# Patient Record
Sex: Male | Born: 1940 | Race: White | Hispanic: No | Marital: Married | Smoking: Never smoker
Health system: Southern US, Community
[De-identification: ages and names within clinical notes are randomized; demographics above are authoritative.]

## PROBLEM LIST (undated history)

## (undated) DIAGNOSIS — Z973 Presence of spectacles and contact lenses: Secondary | ICD-10-CM

## (undated) DIAGNOSIS — G5600 Carpal tunnel syndrome, unspecified upper limb: Secondary | ICD-10-CM

## (undated) DIAGNOSIS — M109 Gout, unspecified: Secondary | ICD-10-CM

## (undated) DIAGNOSIS — E119 Type 2 diabetes mellitus without complications: Secondary | ICD-10-CM

## (undated) DIAGNOSIS — E538 Deficiency of other specified B group vitamins: Secondary | ICD-10-CM

## (undated) DIAGNOSIS — R21 Rash and other nonspecific skin eruption: Secondary | ICD-10-CM

## (undated) DIAGNOSIS — N201 Calculus of ureter: Secondary | ICD-10-CM

## (undated) DIAGNOSIS — D696 Thrombocytopenia, unspecified: Secondary | ICD-10-CM

## (undated) DIAGNOSIS — Z87442 Personal history of urinary calculi: Secondary | ICD-10-CM

## (undated) DIAGNOSIS — Z8739 Personal history of other diseases of the musculoskeletal system and connective tissue: Secondary | ICD-10-CM

## (undated) DIAGNOSIS — E785 Hyperlipidemia, unspecified: Secondary | ICD-10-CM

## (undated) DIAGNOSIS — I10 Essential (primary) hypertension: Secondary | ICD-10-CM

## (undated) DIAGNOSIS — M1990 Unspecified osteoarthritis, unspecified site: Secondary | ICD-10-CM

## (undated) DIAGNOSIS — N183 Chronic kidney disease, stage 3 unspecified: Secondary | ICD-10-CM

## (undated) DIAGNOSIS — A4902 Methicillin resistant Staphylococcus aureus infection, unspecified site: Secondary | ICD-10-CM

## (undated) HISTORY — PX: TONSILLECTOMY: SUR1361

## (undated) HISTORY — DX: Methicillin resistant Staphylococcus aureus infection, unspecified site: A49.02

## (undated) HISTORY — DX: Hyperlipidemia, unspecified: E78.5

## (undated) HISTORY — PX: COLONOSCOPY: SHX174

## (undated) HISTORY — DX: Deficiency of other specified B group vitamins: E53.8

## (undated) HISTORY — PX: MOUTH SURGERY: SHX715

## (undated) HISTORY — PX: CYSTOSCOPY/URETEROSCOPY/HOLMIUM LASER/STENT PLACEMENT: SHX6546

---

## 2008-05-21 ENCOUNTER — Emergency Department (HOSPITAL_COMMUNITY): Admission: EM | Admit: 2008-05-21 | Discharge: 2008-05-21 | Payer: Self-pay | Admitting: Emergency Medicine

## 2009-08-04 IMAGING — CT CT HEAD W/O CM
1 series · 16 of 30 positions shown, 20 images · non-contrast
Comparison: None

CLINICAL DATA: Dizziness.  Syncope.  Weakness.  Diabetes.

CT HEAD WITHOUT CONTRAST
TECHNIQUE: Contiguous axial images were obtained from the base of
the skull through the vertex without contrast.

[Series 2: (id) head 4.8 h37s st · axial · 0.46mm/px · z∈[+1158,+1320]mm · 16 of 36 slices shown, 20 images]
[im 2/36  brain]
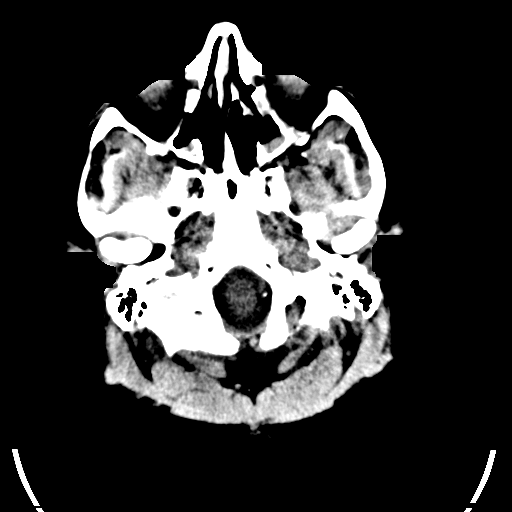
[im 2/36  bone]
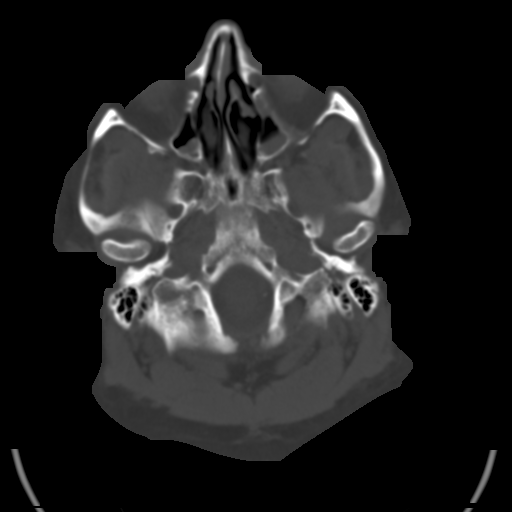
[im 4/36  brain]
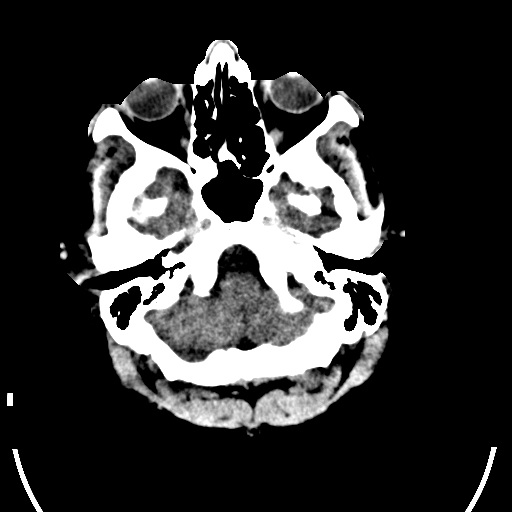
[im 7/36  brain]
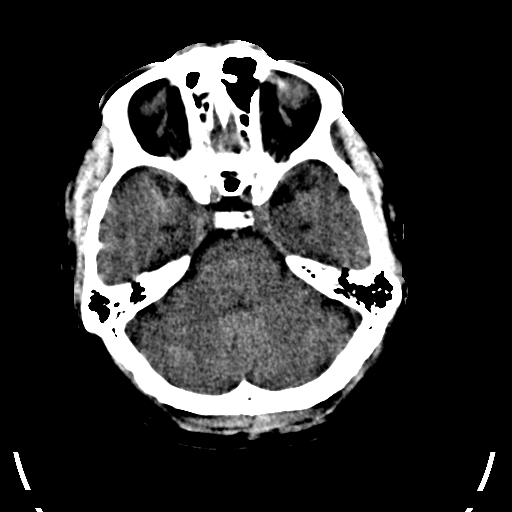
[im 9/36  brain]
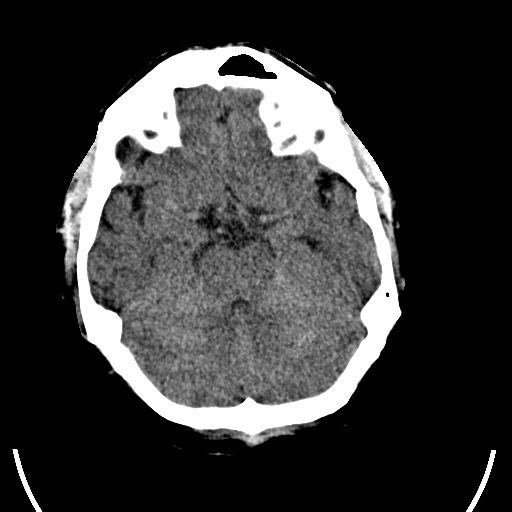
[im 10/36  brain]
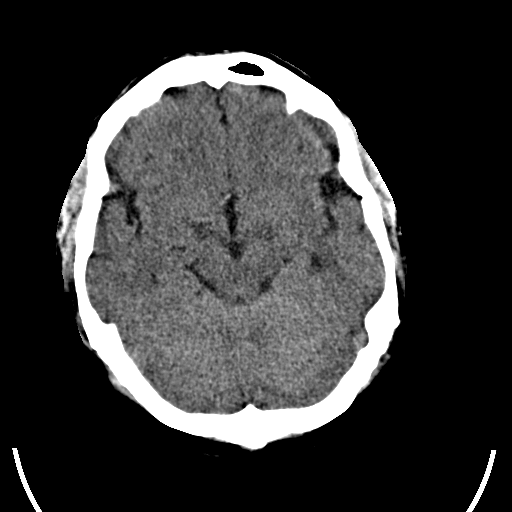
[im 10/36  bone]
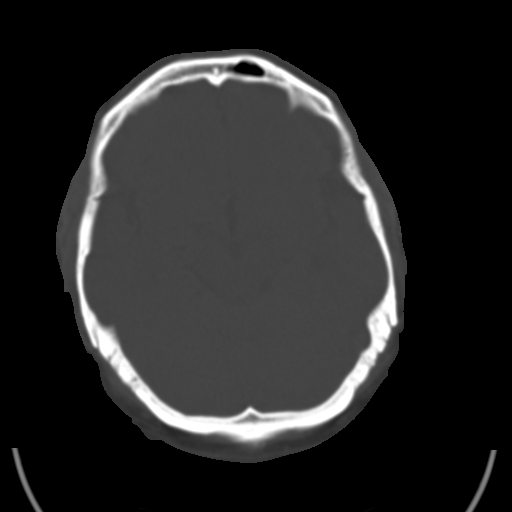
[im 13/36  brain]
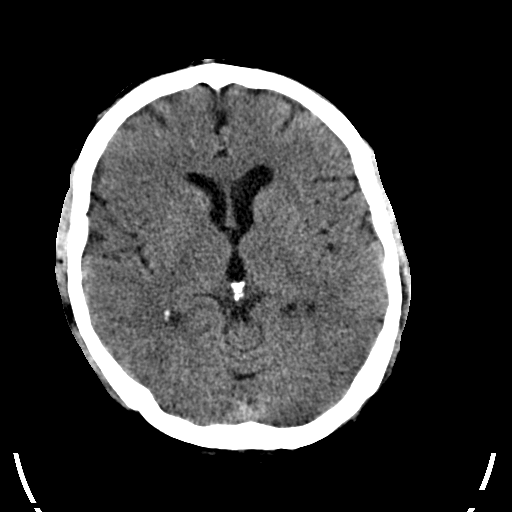
[im 15/36  brain]
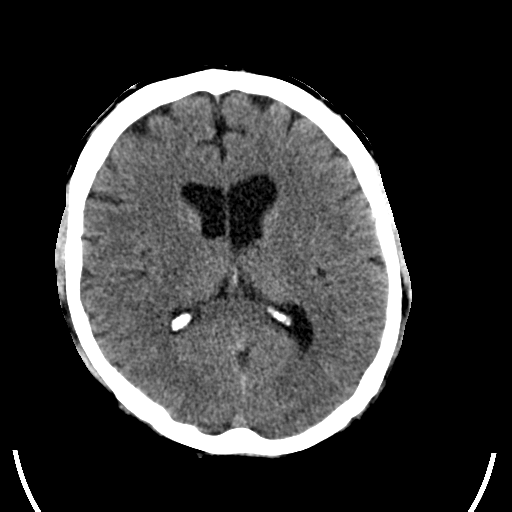
[im 17/36  brain]
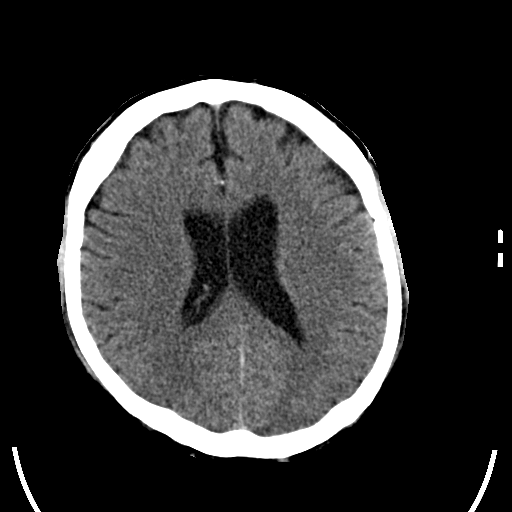
[im 19/36  brain]
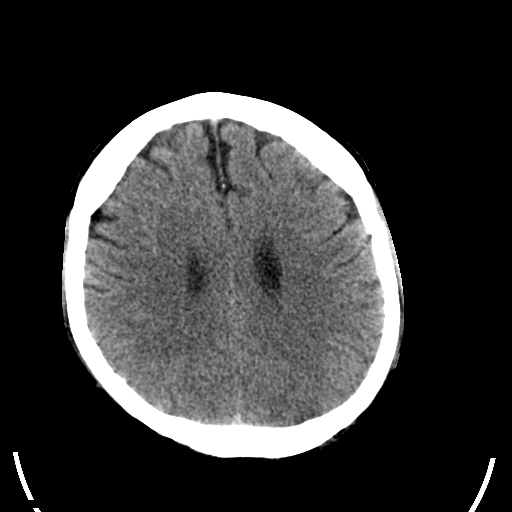
[im 19/36  bone]
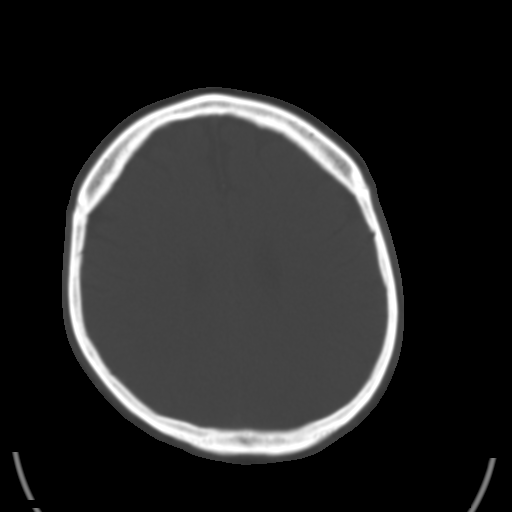
[im 21/36  brain]
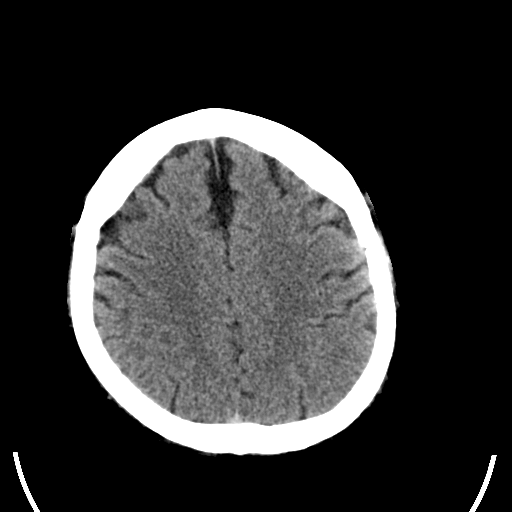
[im 23/36  brain]
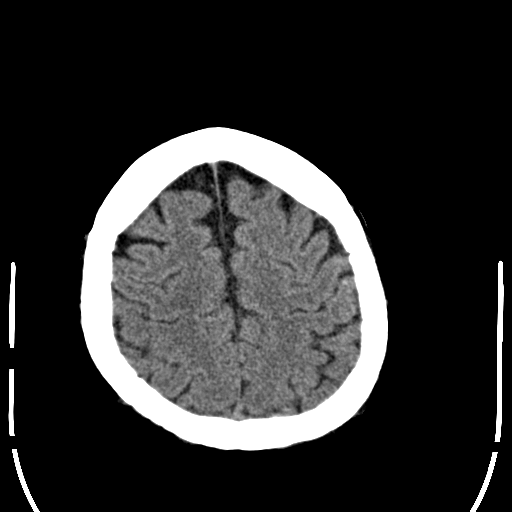
[im 26/36  brain]
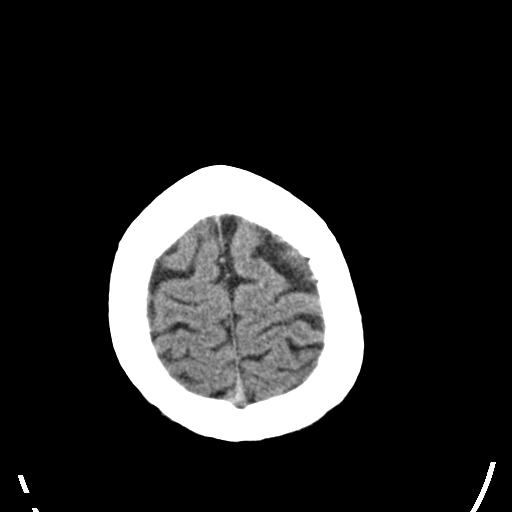
[im 27/36  brain]
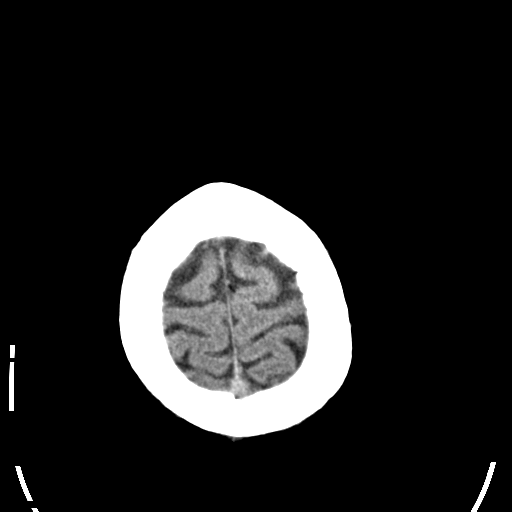
[im 27/36  bone]
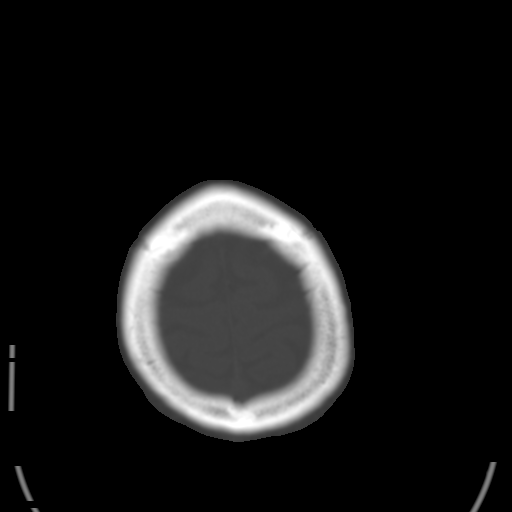
[im 29/36  brain]
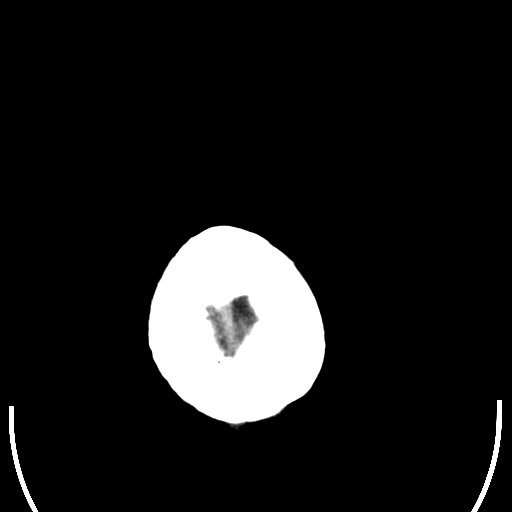
[im 32/36  brain]
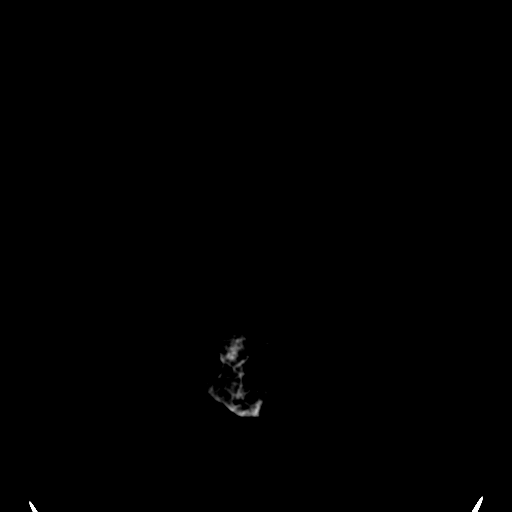
[im 34/36  brain]
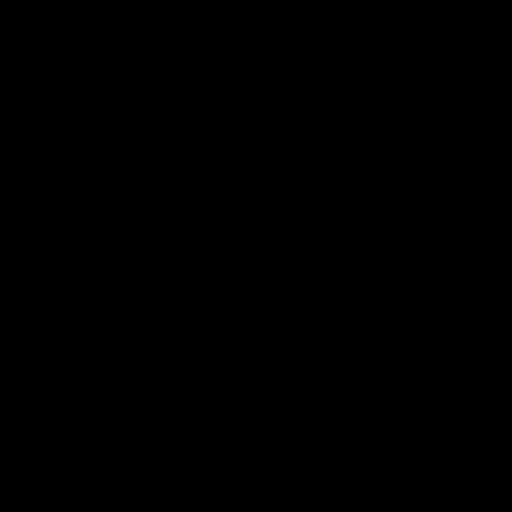

[16 of 30 positions shown; findings below may reference images not displayed]

FINDINGS: The brain does not show gross atrophy.  There is no sign
of chronic or acute infarction, mass lesion, hemorrhage,
hydrocephalus or extra-axial collection.  There is calcification
affecting the major vessels at the base of the brain.  Inflammatory
changes effect paranasal sinuses, particularly the left maxillary
sinus.  The calvarium is unremarkable.
IMPRESSION: No abnormality the brain is identified.  Calcification of the major
vessels at the base of the brain.  Probable sinusitis.

## 2010-06-11 LAB — POCT I-STAT, CHEM 8
BUN: 19 mg/dL (ref 6–23)
Calcium, Ion: 1.04 mmol/L — ABNORMAL LOW (ref 1.12–1.32)
Creatinine, Ser: 1.1 mg/dL (ref 0.4–1.5)
Glucose, Bld: 149 mg/dL — ABNORMAL HIGH (ref 70–99)
Sodium: 139 mEq/L (ref 135–145)
TCO2: 25 mmol/L (ref 0–100)

## 2010-06-11 LAB — GLUCOSE, CAPILLARY: Glucose-Capillary: 181 mg/dL — ABNORMAL HIGH (ref 70–99)

## 2010-06-11 LAB — POCT CARDIAC MARKERS: Myoglobin, poc: 105 ng/mL (ref 12–200)

## 2012-07-11 ENCOUNTER — Other Ambulatory Visit: Payer: Self-pay | Admitting: Orthopedic Surgery

## 2012-07-17 ENCOUNTER — Encounter (HOSPITAL_BASED_OUTPATIENT_CLINIC_OR_DEPARTMENT_OTHER): Payer: Self-pay | Admitting: *Deleted

## 2012-07-17 NOTE — Progress Notes (Signed)
To come in for bmet-ekg-diabetic-lisinopril for renal support-had [facility] study [facility]-did not need cpap

## 2012-07-19 ENCOUNTER — Other Ambulatory Visit: Payer: Self-pay

## 2012-07-19 ENCOUNTER — Encounter (HOSPITAL_BASED_OUTPATIENT_CLINIC_OR_DEPARTMENT_OTHER)
Admission: RE | Admit: 2012-07-19 | Discharge: 2012-07-19 | Disposition: A | Discharge status: Home / Self Care | Payer: Medicare Other | Source: Ambulatory Visit | Attending: Orthopedic Surgery | Admitting: Orthopedic Surgery

## 2012-07-19 LAB — BASIC METABOLIC PANEL
CO2: 24 mEq/L (ref 19–32)
Calcium: 9.6 mg/dL (ref 8.4–10.5)
Potassium: 4.7 mEq/L (ref 3.5–5.1)
Sodium: 139 mEq/L (ref 135–145)

## 2012-07-19 NOTE — Progress Notes (Signed)
Pt here for bloodwork and EKG. Reviewed abnormal EKG with Dr. Jacklynn Bue. No orders taken.

## 2012-07-21 NOTE — H&P (Signed)
Adam Mata is an 72 y.o. male.   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 HPI: Electrodiagnostic studies performed in [facility] revealed severe bilateral carpal tunnel syndrome left worse than right. EMG revealed abnormalities in the abductor pollicis brevis muscles bilaterally.  Past Medical History  Diagnosis Date  . Diabetes mellitus without complication   . Arthritis   . Carpal tunnel syndrome   . Gout     Past Surgical History  Procedure Laterality Date  . Tonsillectomy    . Colonoscopy    . Mouth surgery      History reviewed. No pertinent family history. Social History:  reports that he has never smoked. He does not have any smokeless tobacco history on file. He reports that he does not drink alcohol or use illicit drugs.  Allergies: No Known Allergies  No prescriptions prior to admission    Results for orders placed during the hospital encounter of 07/25/12 (from the past 48 hour(s))  BASIC METABOLIC PANEL     Status: Abnormal   Collection Time    07/19/12  3:00 PM      Result Value Range   Sodium 139  135 - 145 mEq/L   Potassium 4.7  3.5 - 5.1 mEq/L   Chloride 101  96 - 112 mEq/L   CO2 24  19 - 32 mEq/L   Glucose, Bld 123 (*) 70 - 99 mg/dL   BUN 22  6 - 23 mg/dL   Creatinine, Ser 1.61 (*) 0.50 - 1.35 mg/dL   Calcium 9.6  8.4 - 09.6 mg/dL   GFR calc non Af Amer 51 (*) >90 mL/min   GFR calc Af Amer 59 (*) >90 mL/min   Comment:            The eGFR has been calculated     using the CKD EPI equation.     This calculation has not been     validated in all clinical     situations.     eGFR's persistently     <90 mL/min signify     possible Chronic Kidney Disease.   No results found.  Review of Systems  Constitutional: Negative.   HENT: Positive for tinnitus.   Eyes:       History of cataracts. Corrective lenses.  Respiratory: Negative.   Cardiovascular:  Negative.   Gastrointestinal: Negative.   Genitourinary: Negative.   Musculoskeletal: Negative.   Skin: Negative.   Neurological:       Bilateral hand numbness consistent with carpal tunnel syndrome.  Endo/Heme/Allergies: Negative.   Psychiatric/Behavioral: Negative.     Height 5' 8.5" (1.74 m), weight 112.946 kg (249 lb). Physical Exam  Constitutional: He is oriented to person, place, and time. He appears well-developed and well-nourished.  HENT:  Head: Normocephalic and atraumatic.  Eyes: Conjunctivae and EOM are normal. Pupils are equal, round, and reactive to light.  Neck: Normal range of motion. Neck supple.  Cardiovascular: Normal rate, regular rhythm and normal heart sounds.   Respiratory: Effort normal and breath sounds normal.  GI: Soft.  Musculoskeletal:  Background osteoarthritis. Type 2 diabetes and gout. No thenar atrophy noted.  Neurological: He is alert and oriented to person, place, and time. He has normal reflexes.  Psychiatric: He has a normal mood and affect. His behavior is normal. Judgment and thought content normal.     Assessment/Plan Bilateral carpal tunnel syndrome with positive nerve conduction studies and EMG.  Plan right carpal tunnel release.  The surgery aftercare potential risks and benefits were explained in detail. Questions were invited and answered.    Adam Mata JR,Adam Mata V 07/21/2012, 2:37 PM

## 2012-07-25 ENCOUNTER — Encounter (HOSPITAL_BASED_OUTPATIENT_CLINIC_OR_DEPARTMENT_OTHER): Payer: Self-pay | Admitting: Certified Registered Nurse Anesthetist

## 2012-07-25 ENCOUNTER — Ambulatory Visit (HOSPITAL_BASED_OUTPATIENT_CLINIC_OR_DEPARTMENT_OTHER): Payer: Medicare Other | Admitting: Certified Registered Nurse Anesthetist

## 2012-07-25 ENCOUNTER — Ambulatory Visit (HOSPITAL_BASED_OUTPATIENT_CLINIC_OR_DEPARTMENT_OTHER)
Admission: RE | Admit: 2012-07-25 | Discharge: 2012-07-25 | Disposition: A | Discharge status: Home / Self Care | Payer: Medicare Other | Source: Ambulatory Visit | Attending: Orthopedic Surgery | Admitting: Orthopedic Surgery

## 2012-07-25 ENCOUNTER — Encounter (HOSPITAL_BASED_OUTPATIENT_CLINIC_OR_DEPARTMENT_OTHER)
Admission: RE | Disposition: A | Discharge status: Home / Self Care | Payer: Self-pay | Source: Ambulatory Visit | Attending: Orthopedic Surgery

## 2012-07-25 DIAGNOSIS — G5600 Carpal tunnel syndrome, unspecified upper limb: Secondary | ICD-10-CM | POA: Insufficient documentation

## 2012-07-25 DIAGNOSIS — Z01810 Encounter for preprocedural cardiovascular examination: Secondary | ICD-10-CM | POA: Insufficient documentation

## 2012-07-25 DIAGNOSIS — Z01812 Encounter for preprocedural laboratory examination: Secondary | ICD-10-CM | POA: Insufficient documentation

## 2012-07-25 DIAGNOSIS — M129 Arthropathy, unspecified: Secondary | ICD-10-CM | POA: Insufficient documentation

## 2012-07-25 DIAGNOSIS — M109 Gout, unspecified: Secondary | ICD-10-CM | POA: Insufficient documentation

## 2012-07-25 DIAGNOSIS — E119 Type 2 diabetes mellitus without complications: Secondary | ICD-10-CM | POA: Insufficient documentation

## 2012-07-25 DIAGNOSIS — Z6838 Body mass index (BMI) 38.0-38.9, adult: Secondary | ICD-10-CM | POA: Insufficient documentation

## 2012-07-25 DIAGNOSIS — H269 Unspecified cataract: Secondary | ICD-10-CM | POA: Insufficient documentation

## 2012-07-25 DIAGNOSIS — H9319 Tinnitus, unspecified ear: Secondary | ICD-10-CM | POA: Insufficient documentation

## 2012-07-25 HISTORY — DX: Unspecified osteoarthritis, unspecified site: M19.90

## 2012-07-25 HISTORY — DX: Type 2 diabetes mellitus without complications: E11.9

## 2012-07-25 HISTORY — DX: Carpal tunnel syndrome, unspecified upper limb: G56.00

## 2012-07-25 HISTORY — DX: Gout, unspecified: M10.9

## 2012-07-25 HISTORY — PX: CARPAL TUNNEL RELEASE: SHX101

## 2012-07-25 LAB — GLUCOSE, CAPILLARY
Glucose-Capillary: 143 mg/dL — ABNORMAL HIGH (ref 70–99)
Glucose-Capillary: 117 mg/dL — ABNORMAL HIGH (ref 70–99)

## 2012-07-25 SURGERY — CARPAL TUNNEL RELEASE
Anesthesia: General | Site: Wrist | Laterality: Right | Wound class: Clean

## 2012-07-25 MED ORDER — FENTANYL CITRATE 0.05 MG/ML IJ SOLN
INTRAMUSCULAR | Status: DC | PRN
  Administered 2012-07-25: 50 ug via INTRAVENOUS

## 2012-07-25 MED ORDER — OXYCODONE-ACETAMINOPHEN 5-325 MG PO TABS: ORAL_TABLET | ORAL | Status: DC

## 2012-07-25 MED ORDER — LIDOCAINE HCL 2 % IJ SOLN
INTRAMUSCULAR | Status: DC | PRN
  Administered 2012-07-25: 2 mL

## 2012-07-25 MED ORDER — ONDANSETRON HCL 4 MG/2ML IJ SOLN
INTRAMUSCULAR | Status: DC | PRN
  Administered 2012-07-25: 4 mg via INTRAVENOUS

## 2012-07-25 MED ORDER — FENTANYL CITRATE 0.05 MG/ML IJ SOLN: 50.0000 ug | INTRAMUSCULAR | Status: DC | PRN

## 2012-07-25 MED ORDER — PROPOFOL 10 MG/ML IV BOLUS
INTRAVENOUS | Status: DC | PRN
  Administered 2012-07-25: 200 mg via INTRAVENOUS

## 2012-07-25 MED ORDER — CHLORHEXIDINE GLUCONATE 4 % EX LIQD: 60.0000 mL | Freq: Once | CUTANEOUS | Status: DC

## 2012-07-25 MED ORDER — LACTATED RINGERS IV SOLN
INTRAVENOUS | Status: DC
  Administered 2012-07-25: 10:00:00 via INTRAVENOUS

## 2012-07-25 MED ORDER — MIDAZOLAM HCL 2 MG/2ML IJ SOLN: 1.0000 mg | INTRAMUSCULAR | Status: DC | PRN

## 2012-07-25 MED ORDER — DEXAMETHASONE SODIUM PHOSPHATE 10 MG/ML IJ SOLN
INTRAMUSCULAR | Status: DC | PRN
  Administered 2012-07-25: 4 mg via INTRAVENOUS

## 2012-07-25 MED ORDER — LIDOCAINE HCL (CARDIAC) 20 MG/ML IV SOLN
INTRAVENOUS | Status: DC | PRN
  Administered 2012-07-25: 60 mg via INTRAVENOUS

## 2012-07-25 SURGICAL SUPPLY — 35 items
BANDAGE ADHESIVE 1X3 (GAUZE/BANDAGES/DRESSINGS) IMPLANT
BANDAGE ELASTIC 3 VELCRO ST LF (GAUZE/BANDAGES/DRESSINGS) ×2 IMPLANT
BLADE SURG 15 STRL LF DISP TIS (BLADE) ×1 IMPLANT
BLADE SURG 15 STRL SS (BLADE) ×1
BNDG ESMARK 4X9 LF (GAUZE/BANDAGES/DRESSINGS) ×2 IMPLANT
BRUSH SCRUB EZ PLAIN DRY (MISCELLANEOUS) ×2 IMPLANT
CLOTH BEACON ORANGE TIMEOUT ST (SAFETY) ×2 IMPLANT
CORDS BIPOLAR (ELECTRODE) ×2 IMPLANT
COVER MAYO STAND STRL (DRAPES) ×2 IMPLANT
COVER TABLE BACK 60X90 (DRAPES) ×2 IMPLANT
CUFF TOURNIQUET SINGLE 18IN (TOURNIQUET CUFF) ×2 IMPLANT
DECANTER SPIKE VIAL GLASS SM (MISCELLANEOUS) IMPLANT
DRAPE EXTREMITY T 121X128X90 (DRAPE) ×2 IMPLANT
DRAPE SURG 17X23 STRL (DRAPES) ×2 IMPLANT
GLOVE BIO SURGEON STRL SZ 6.5 (GLOVE) ×2 IMPLANT
GLOVE BIOGEL M STRL SZ7.5 (GLOVE) IMPLANT
GLOVE ORTHO TXT STRL SZ7.5 (GLOVE) ×2 IMPLANT
GOWN BRE IMP PREV XXLGXLNG (GOWN DISPOSABLE) ×2 IMPLANT
GOWN PREVENTION PLUS XLARGE (GOWN DISPOSABLE) ×2 IMPLANT
NEEDLE 27GAX1X1/2 (NEEDLE) ×2 IMPLANT
PACK BASIN DAY SURGERY FS (CUSTOM PROCEDURE TRAY) ×2 IMPLANT
PAD CAST 3X4 CTTN HI CHSV (CAST SUPPLIES) ×1 IMPLANT
PADDING CAST ABS 4INX4YD NS (CAST SUPPLIES) ×1
PADDING CAST ABS COTTON 4X4 ST (CAST SUPPLIES) ×1 IMPLANT
PADDING CAST COTTON 3X4 STRL (CAST SUPPLIES) ×1
SPLINT PLASTER CAST XFAST 3X15 (CAST SUPPLIES) ×5 IMPLANT
SPLINT PLASTER XTRA FASTSET 3X (CAST SUPPLIES) ×5
SPONGE GAUZE 4X4 12PLY (GAUZE/BANDAGES/DRESSINGS) ×2 IMPLANT
STOCKINETTE 4X48 STRL (DRAPES) ×2 IMPLANT
STRIP CLOSURE SKIN 1/2X4 (GAUZE/BANDAGES/DRESSINGS) ×2 IMPLANT
SUT PROLENE 3 0 PS 2 (SUTURE) ×2 IMPLANT
SYR 3ML 23GX1 SAFETY (SYRINGE) IMPLANT
SYR CONTROL 10ML LL (SYRINGE) ×2 IMPLANT
TRAY DSU PREP LF (CUSTOM PROCEDURE TRAY) ×2 IMPLANT
UNDERPAD 30X30 INCONTINENT (UNDERPADS AND DIAPERS) ×2 IMPLANT

## 2012-07-25 NOTE — Anesthesia Preprocedure Evaluation (Signed)
Anesthesia Evaluation  Patient identified by MRN, date of birth, ID band Patient awake    Reviewed: Allergy & Precautions, H&P , NPO status , Patient's Chart, lab work & pertinent test results  Airway Mallampati: I TM Distance: >3 FB Neck ROM: Full    Dental  (+) Teeth Intact and Dental Advisory Given   Pulmonary  breath sounds clear to auscultation        Cardiovascular Rhythm:Regular Rate:Normal     Neuro/Psych    GI/Hepatic   Endo/Other  diabetes, Well Controlled, Type 2, Oral Hypoglycemic AgentsMorbid obesity  Renal/GU      Musculoskeletal   Abdominal   Peds  Hematology   Anesthesia Other Findings   Reproductive/Obstetrics                           Anesthesia Physical Anesthesia Plan  ASA: II  Anesthesia Plan: General   Post-op Pain Management:    Induction: Intravenous  Airway Management Planned: LMA  Additional Equipment:   Intra-op Plan:   Post-operative Plan: Extubation in OR  Informed Consent: I have reviewed the patients History and Physical, chart, labs and discussed the procedure including the risks, benefits and alternatives for the proposed anesthesia with the patient or authorized representative who has indicated his/her understanding and acceptance.   Dental advisory given  Plan Discussed with: CRNA, Anesthesiologist and Surgeon  Anesthesia Plan Comments:         Anesthesia Quick Evaluation

## 2012-07-25 NOTE — Anesthesia Postprocedure Evaluation (Signed)
  Anesthesia Post-op Note  Patient: Adam Mata  Procedure(s) Performed: Procedure(s): CARPAL TUNNEL RELEASE (Right)  Patient Location: PACU  Anesthesia Type:General  Level of Consciousness: awake, alert  and oriented  Airway and Oxygen Therapy: Patient Spontanous Breathing  Post-op Pain: mild  Post-op Assessment: Post-op Vital signs reviewed  Post-op Vital Signs: Reviewed  Complications: No apparent anesthesia complications

## 2012-07-25 NOTE — Interval H&P Note (Signed)
History and Physical Interval Note:  07/25/2012 10:44 AM  Adam Mata  has presented today for surgery, with the diagnosis of RIGHT AND LEFT CARPAL TUNNEL SYNDROME  The various methods of treatment have been discussed with the patient and family. After consideration of risks, benefits and other options for treatment, the patient has consented to  Procedure(s): CARPAL TUNNEL RELEASE (Right) as a surgical intervention .  The patient's history has been reviewed, patient examined, no change in status, stable for surgery.  I have reviewed the patient's chart and labs.  Questions were answered to the patient's satisfaction.     Shanita Kanan JR,Yomaira Solar V

## 2012-07-25 NOTE — Transfer of Care (Signed)
Immediate Anesthesia Transfer of Care Note  Patient: Adam Mata  Procedure(s) Performed: Procedure(s): CARPAL TUNNEL RELEASE (Right)  Patient Location: PACU  Anesthesia Type:General  Level of Consciousness: awake, alert , oriented and patient cooperative  Airway & Oxygen Therapy: Patient Spontanous Breathing and Patient connected to face mask oxygen  Post-op Assessment: Report given to PACU RN and Post -op Vital signs reviewed and stable  Post vital signs: Reviewed and stable  Complications: No apparent anesthesia complications

## 2012-07-25 NOTE — Brief Op Note (Signed)
07/25/2012  11:22 AM  PATIENT:  Adam Mata  72 y.o. male  PRE-OPERATIVE DIAGNOSIS:  RIGHT AND LEFT CARPAL TUNNEL SYNDROME  POST-OPERATIVE DIAGNOSIS:  RIGHT CARPAL TUNNEL SYNDROME  PROCEDURE:  Procedure(s): CARPAL TUNNEL RELEASE (Right)  SURGEON:  Surgeon(s) and Role:    * Wyn Forster., MD - Primary  PHYSICIAN ASSISTANT:   ASSISTANTS: surgical technician   ANESTHESIA:   general  EBL:     BLOOD ADMINISTERED:none  DRAINS: none   LOCAL MEDICATIONS USED:  XYLOCAINE   SPECIMEN:  No Specimen  DISPOSITION OF SPECIMEN:  N/A  COUNTS:  YES  TOURNIQUET:   Total Tourniquet Time Documented: Upper Arm (Right) - 11 minutes Total: Upper Arm (Right) - 11 minutes   DICTATION: .Other Dictation: Dictation Number [id number]  PLAN OF CARE: Discharge to home after PACU  PATIENT DISPOSITION:  PACU - hemodynamically stable.   Delay start of Pharmacological VTE agent (>24hrs) due to surgical blood loss or risk of bleeding: not applicable

## 2012-07-25 NOTE — Op Note (Signed)
[id number] 

## 2012-07-26 NOTE — Op Note (Signed)
Adam Mata, Adam Mata              ACCOUNT NO.:  [id number]  MEDICAL RECORD NO.:  [id number]  LOCATION:                                 FACILITY:  PHYSICIAN:  Katy Fitch. Damoni Erker, M.D.      DATE OF BIRTH:  DATE OF PROCEDURE:  07/25/2012 DATE OF DISCHARGE:                              OPERATIVE REPORT   PREOPERATIVE DIAGNOSES:  Chronic carpal tunnel syndrome, right wrist with background gout and diabetes.  POSTOPERATIVE DIAGNOSES:  Chronic carpal tunnel syndrome, right wrist with background gout and diabetes.  OPERATION:  Release of right transverse carpal ligament.  OPERATING SURGEON:  Katy Fitch. Armand Preast, MD  ASSISTANT:  Surgical technician.  ANESTHESIA:  General by LMA.  SUPERVISING ANESTHESIOLOGIST:  Sheldon Silvan, MD  INDICATIONS:  Adam Mata is a 72 year old gentleman, referred through the courtesy of Dr. Renae Fickle of [place], [facility] for evaluation and management of hand numbness.  Adam Mata, has a complex past medical history of chronic gout, hypertension, and diabetes.  He is under the able care of Dr. Samuel Mata, who is managing disease predicament.  During his recent medical evaluation, he was noted have bilateral hand numbness.  He is referred to see Dr. Cathrine Muster at [facility] and had a length of diagnostic studies which confirmed severe bilateral carpal tunnel syndrome.  He presented for Hand Surgery consult.  We advised him to consider staged bilateral carpal tunnel release beginning on the right side.  Preoperatively, he was reminded the potential that risks and benefits of surgery.  He understands with his severe carpal tunnel syndrome, he will need to wait 4-5 months to see the full benefit of surgery, but should have relief of his night pain, relatively quickly and will gradually have improve sensibility over the next 5-6 months.  Questions regarding the anticipated procedure invited and answered in detail.  He was interviewed in the holding area by  Dr. Ivin Booty, who provided detailed anesthesia informed consent.  He recommended general anesthesia by LMA technique which was accepted by Adam Mata.  In the holding area, Adam Mata right hand was marked as the proper surgical site per protocol.  PROCEDURE IN DETAIL:  Adam Mata was brought to room 2 of the [facility] and placed in supine position on the operating table.  Following the induction of general anesthesia by LMA technique under Dr. Ivin Booty, directed supervision, the right hand and arm were prepped with Betadine soap and solution, sterilely draped.  A pneumatic tourniquet was applied to the proximal right brachium and set at 220 mmHg.  Following exsanguination of right arm with Esmarch bandage, arterial tourniquet was inflated.  A routine surgical time-out was accomplished.  A short 2-cm incision was fashioned in line of the ring finger of the palm.  Subcutaneous tissues were carefully divided in the palmar fascia. This split longitudinally through the common sensory branch of median nerve.  These were followed back to the median nerve proper which was gently isolated from the transverse carpal ligament, Penfield 4 elevator.  The ulnar bursa was noted to be quite fibrotic with adhesions between the median nerve and the portion of the transverse carpal ligament.  With great care, the transcarpal  ligament was released along its ulnar border extending into the distal forearm.  The volar forearm fascia was likewise released with scissors.  This widely opened carpal canal.  No mass or other predicaments were noted.  Bleeding points along the margin of the released ligament were electrocauterized with bipolar current followed by repair of the skin with intradermal 3-0 Prolene suture.  A compressive dressing was applied with volar plaster splint maintaining the wrist in 10 degrees of dorsiflexion.  For aftercare, Mr. Krantz is provided a prescription for  Percocet 5 mg 1 or 2 tablets p.o. q.4-6 h. p.r.n. pain, 20 tablets without refill.     Katy Fitch Valgene Deloatch, M.D.     RVS/MEDQ  D:  07/25/2012  T:  07/26/2012  Job:  [id number]  cc:   Renae Fickle Fax: [phone]

## 2012-08-25 ENCOUNTER — Other Ambulatory Visit: Payer: Self-pay | Admitting: Orthopedic Surgery

## 2012-08-30 ENCOUNTER — Encounter (HOSPITAL_BASED_OUTPATIENT_CLINIC_OR_DEPARTMENT_OTHER): Payer: Self-pay | Admitting: *Deleted

## 2012-08-30 NOTE — Progress Notes (Signed)
Pt was here 5/14 for rt ctr-did well-no pain-ready to get lt done-will need istat-had ekg 5/14

## 2012-09-04 NOTE — H&P (Signed)
  Adam Mata is an 72 y.o. male.   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 He has had a history of bilateral hand numbness and discomfort. [facility] He was partially worked up for carpal tunnel syndrome and had electrodiagnostic studies ultimately with Su Walha in [place] demonstrating severe bilateral carpal tunnel syndrome. His conduction velocity across the left elbow of the ulnar nerve was slow but not frankly pathologic. He does not note numbness in the small fingers bilaterally.    Past Medical History  Diagnosis Date  . Diabetes mellitus without complication   . Arthritis   . Carpal tunnel syndrome   . Gout     Past Surgical History  Procedure Laterality Date  . Tonsillectomy    . Colonoscopy    . Mouth surgery    . Carpal tunnel release Right 07/25/2012    Procedure: CARPAL TUNNEL RELEASE;  Surgeon: Tranquilino Fischler V Sylvan Lahm Jr., MD;  Location: [facility];  Service: Orthopedics;  Laterality: Right;    History reviewed. No pertinent family history. Social History:  reports that he has never smoked. He does not have any smokeless tobacco history on file. He reports that he does not drink alcohol or use illicit drugs.  Allergies: No Known Allergies  No prescriptions prior to admission    No results found for this or any previous visit (from the past 48 hour(s)).  No results found.   Pertinent items are noted in HPI.  Height 5' 8" (1.727 m), weight 113.399 kg (250 lb).  General appearance: alert Head: Normocephalic, without obvious abnormality Neck: supple, symmetrical, trachea midline Resp: clear to auscultation bilaterally Cardio: regular rate and rhythm GI: normal findings: bowel sounds normal Extremities:  Inspection of his hands reveals no thenar atrophy. His sweat patterns are  diminished in the median distribution bilaterally. His dermatoglyphics are preserved in median and ulnar. He has a negative elbow hyperflexion test. He has positive provocative signs of carpal tunnel syndrome. He has no sign of STS at this time but reports he has had some occasional triggering of his fingers in the past.   His electrodiagnostic studies performed by Dr. Walha are reviewed in detail. He has severe bilateral carpal tunnel syndrome left worse than right. He has positive electrodiagnostic studies with polyphasics in the abductor pollicis brevis bilaterally.  Pulses: 2+ and symmetric Skin: normal Neurologic: Grossly normal    Assessment/Plan Impression: Left CTS  Plan: TO the OR for left CTR.The procedure, risks,benefits and post-op course were discussed with the patient at length and they were in agreement with the plan.  DASNOIT,Shronda Boeh J 09/04/2012, 3:56 PM   H&P documentation: 09/05/2012  -History and Physical Reviewed  -Patient has been re-examined  -No change in the plan of care  Nicki Gracy V Vickey Ewbank Jr, MD     

## 2012-09-05 ENCOUNTER — Ambulatory Visit (HOSPITAL_BASED_OUTPATIENT_CLINIC_OR_DEPARTMENT_OTHER): Admission: RE | Admit: 2012-09-05 | Payer: Medicare Other | Source: Ambulatory Visit | Admitting: Orthopedic Surgery

## 2012-09-05 ENCOUNTER — Other Ambulatory Visit: Payer: Self-pay | Admitting: Orthopedic Surgery

## 2012-09-05 SURGERY — CARPAL TUNNEL RELEASE
Anesthesia: General | Site: Wrist | Laterality: Left

## 2012-09-12 ENCOUNTER — Encounter (HOSPITAL_BASED_OUTPATIENT_CLINIC_OR_DEPARTMENT_OTHER): Payer: Self-pay | Admitting: *Deleted

## 2012-09-19 ENCOUNTER — Ambulatory Visit (HOSPITAL_BASED_OUTPATIENT_CLINIC_OR_DEPARTMENT_OTHER)
Admission: RE | Admit: 2012-09-19 | Discharge: 2012-09-19 | Disposition: A | Discharge status: Home / Self Care | Payer: Medicare Other | Source: Ambulatory Visit | Attending: Orthopedic Surgery | Admitting: Orthopedic Surgery

## 2012-09-19 ENCOUNTER — Encounter (HOSPITAL_BASED_OUTPATIENT_CLINIC_OR_DEPARTMENT_OTHER)
Admission: RE | Disposition: A | Discharge status: Home / Self Care | Payer: Self-pay | Source: Ambulatory Visit | Attending: Orthopedic Surgery

## 2012-09-19 ENCOUNTER — Ambulatory Visit (HOSPITAL_BASED_OUTPATIENT_CLINIC_OR_DEPARTMENT_OTHER): Payer: Medicare Other | Admitting: Certified Registered"

## 2012-09-19 ENCOUNTER — Encounter (HOSPITAL_BASED_OUTPATIENT_CLINIC_OR_DEPARTMENT_OTHER): Payer: Self-pay | Admitting: Certified Registered"

## 2012-09-19 ENCOUNTER — Encounter (HOSPITAL_BASED_OUTPATIENT_CLINIC_OR_DEPARTMENT_OTHER): Payer: Self-pay | Admitting: *Deleted

## 2012-09-19 DIAGNOSIS — M6530 Trigger finger, unspecified finger: Secondary | ICD-10-CM | POA: Insufficient documentation

## 2012-09-19 DIAGNOSIS — M129 Arthropathy, unspecified: Secondary | ICD-10-CM | POA: Insufficient documentation

## 2012-09-19 DIAGNOSIS — M109 Gout, unspecified: Secondary | ICD-10-CM | POA: Insufficient documentation

## 2012-09-19 DIAGNOSIS — G5600 Carpal tunnel syndrome, unspecified upper limb: Secondary | ICD-10-CM | POA: Insufficient documentation

## 2012-09-19 DIAGNOSIS — E119 Type 2 diabetes mellitus without complications: Secondary | ICD-10-CM | POA: Insufficient documentation

## 2012-09-19 DIAGNOSIS — Z6838 Body mass index (BMI) 38.0-38.9, adult: Secondary | ICD-10-CM | POA: Insufficient documentation

## 2012-09-19 HISTORY — PX: CARPAL TUNNEL RELEASE: SHX101

## 2012-09-19 HISTORY — PX: TRIGGER FINGER RELEASE: SHX641

## 2012-09-19 LAB — POCT I-STAT, CHEM 8
Creatinine, Ser: 1 mg/dL (ref 0.50–1.35)
HCT: 45 % (ref 39.0–52.0)
Hemoglobin: 15.3 g/dL (ref 13.0–17.0)
Sodium: 140 mEq/L (ref 135–145)
TCO2: 26 mmol/L (ref 0–100)

## 2012-09-19 LAB — GLUCOSE, CAPILLARY

## 2012-09-19 SURGERY — CARPAL TUNNEL RELEASE
Anesthesia: General | Site: Wrist | Laterality: Left | Wound class: Clean

## 2012-09-19 MED ORDER — CHLORHEXIDINE GLUCONATE 4 % EX LIQD: 60.0000 mL | Freq: Once | CUTANEOUS | Status: DC

## 2012-09-19 MED ORDER — PHENYLEPHRINE HCL 10 MG/ML IJ SOLN
10.0000 mg | INTRAVENOUS | Status: DC | PRN
  Administered 2012-09-19: 50 ug/min via INTRAVENOUS

## 2012-09-19 MED ORDER — FENTANYL CITRATE 0.05 MG/ML IJ SOLN
INTRAMUSCULAR | Status: DC | PRN
  Administered 2012-09-19: 75 ug via INTRAVENOUS

## 2012-09-19 MED ORDER — OXYCODONE HCL 5 MG/5ML PO SOLN: 5.0000 mg | Freq: Once | ORAL | Status: DC | PRN

## 2012-09-19 MED ORDER — MEPERIDINE HCL 25 MG/ML IJ SOLN: 6.2500 mg | INTRAMUSCULAR | Status: DC | PRN

## 2012-09-19 MED ORDER — EPHEDRINE SULFATE 50 MG/ML IJ SOLN
INTRAMUSCULAR | Status: DC | PRN
  Administered 2012-09-19: 5 mg via INTRAVENOUS
  Administered 2012-09-19: 10 mg via INTRAVENOUS
  Administered 2012-09-19: 5 mg via INTRAVENOUS

## 2012-09-19 MED ORDER — MIDAZOLAM HCL 2 MG/2ML IJ SOLN: 1.0000 mg | INTRAMUSCULAR | Status: DC | PRN

## 2012-09-19 MED ORDER — FENTANYL CITRATE 0.05 MG/ML IJ SOLN: 25.0000 ug | INTRAMUSCULAR | Status: DC | PRN

## 2012-09-19 MED ORDER — LIDOCAINE HCL 2 % IJ SOLN
INTRAMUSCULAR | Status: DC | PRN
  Administered 2012-09-19: 7 mL

## 2012-09-19 MED ORDER — OXYCODONE HCL 5 MG PO TABS: 5.0000 mg | ORAL_TABLET | Freq: Once | ORAL | Status: DC | PRN

## 2012-09-19 MED ORDER — MIDAZOLAM HCL 2 MG/2ML IJ SOLN: 0.5000 mg | Freq: Once | INTRAMUSCULAR | Status: DC | PRN

## 2012-09-19 MED ORDER — PROMETHAZINE HCL 25 MG/ML IJ SOLN: 6.2500 mg | INTRAMUSCULAR | Status: DC | PRN

## 2012-09-19 MED ORDER — PROPOFOL 10 MG/ML IV BOLUS
INTRAVENOUS | Status: DC | PRN
  Administered 2012-09-19: 200 mg via INTRAVENOUS
  Administered 2012-09-19: 100 mg via INTRAVENOUS

## 2012-09-19 MED ORDER — ONDANSETRON HCL 4 MG/2ML IJ SOLN
INTRAMUSCULAR | Status: DC | PRN
  Administered 2012-09-19: 4 mg via INTRAVENOUS

## 2012-09-19 MED ORDER — LIDOCAINE HCL (CARDIAC) 20 MG/ML IV SOLN
INTRAVENOUS | Status: DC | PRN
  Administered 2012-09-19: 60 mg via INTRAVENOUS

## 2012-09-19 MED ORDER — OXYCODONE-ACETAMINOPHEN 5-325 MG PO TABS: ORAL_TABLET | ORAL | Status: DC

## 2012-09-19 MED ORDER — DEXAMETHASONE SODIUM PHOSPHATE 10 MG/ML IJ SOLN
INTRAMUSCULAR | Status: DC | PRN
  Administered 2012-09-19: 4 mg via INTRAVENOUS

## 2012-09-19 MED ORDER — FENTANYL CITRATE 0.05 MG/ML IJ SOLN: 50.0000 ug | INTRAMUSCULAR | Status: DC | PRN

## 2012-09-19 MED ORDER — LACTATED RINGERS IV SOLN
INTRAVENOUS | Status: DC
  Administered 2012-09-19: 09:00:00 via INTRAVENOUS

## 2012-09-19 SURGICAL SUPPLY — 35 items
BANDAGE ADHESIVE 1X3 (GAUZE/BANDAGES/DRESSINGS) IMPLANT
BANDAGE ELASTIC 3 VELCRO ST LF (GAUZE/BANDAGES/DRESSINGS) ×3 IMPLANT
BLADE SURG 15 STRL LF DISP TIS (BLADE) ×2 IMPLANT
BLADE SURG 15 STRL SS (BLADE) ×1
BNDG ESMARK 4X9 LF (GAUZE/BANDAGES/DRESSINGS) ×3 IMPLANT
BRUSH SCRUB EZ PLAIN DRY (MISCELLANEOUS) ×3 IMPLANT
CLOTH BEACON ORANGE TIMEOUT ST (SAFETY) ×3 IMPLANT
CORDS BIPOLAR (ELECTRODE) ×3 IMPLANT
COVER MAYO STAND STRL (DRAPES) ×3 IMPLANT
COVER TABLE BACK 60X90 (DRAPES) ×3 IMPLANT
CUFF TOURNIQUET SINGLE 18IN (TOURNIQUET CUFF) ×3 IMPLANT
DECANTER SPIKE VIAL GLASS SM (MISCELLANEOUS) IMPLANT
DRAPE EXTREMITY T 121X128X90 (DRAPE) ×3 IMPLANT
DRAPE SURG 17X23 STRL (DRAPES) ×3 IMPLANT
GLOVE BIOGEL M STRL SZ7.5 (GLOVE) ×3 IMPLANT
GLOVE ORTHO TXT STRL SZ7.5 (GLOVE) ×3 IMPLANT
GOWN BRE IMP PREV XXLGXLNG (GOWN DISPOSABLE) ×3 IMPLANT
GOWN PREVENTION PLUS XLARGE (GOWN DISPOSABLE) ×3 IMPLANT
NEEDLE 27GAX1X1/2 (NEEDLE) ×3 IMPLANT
PACK BASIN DAY SURGERY FS (CUSTOM PROCEDURE TRAY) ×3 IMPLANT
PAD CAST 3X4 CTTN HI CHSV (CAST SUPPLIES) ×2 IMPLANT
PADDING CAST ABS 4INX4YD NS (CAST SUPPLIES) ×1
PADDING CAST ABS COTTON 4X4 ST (CAST SUPPLIES) ×2 IMPLANT
PADDING CAST COTTON 3X4 STRL (CAST SUPPLIES) ×1
SPLINT PLASTER CAST XFAST 3X15 (CAST SUPPLIES) ×10 IMPLANT
SPLINT PLASTER XTRA FASTSET 3X (CAST SUPPLIES) ×5
SPONGE GAUZE 4X4 12PLY (GAUZE/BANDAGES/DRESSINGS) ×3 IMPLANT
STOCKINETTE 4X48 STRL (DRAPES) ×3 IMPLANT
STRIP CLOSURE SKIN 1/2X4 (GAUZE/BANDAGES/DRESSINGS) ×3 IMPLANT
SUT PROLENE 3 0 PS 2 (SUTURE) ×3 IMPLANT
SYR 3ML 23GX1 SAFETY (SYRINGE) IMPLANT
SYR CONTROL 10ML LL (SYRINGE) ×3 IMPLANT
TOWEL OR 17X24 6PK STRL BLUE (TOWEL DISPOSABLE) ×3 IMPLANT
TRAY DSU PREP LF (CUSTOM PROCEDURE TRAY) ×3 IMPLANT
UNDERPAD 30X30 INCONTINENT (UNDERPADS AND DIAPERS) ×3 IMPLANT

## 2012-09-19 NOTE — Anesthesia Postprocedure Evaluation (Signed)
  Anesthesia Post-op Note  Patient: Adam Mata  Procedure(s) Performed: Procedure(s): LEFT CARPAL TUNNEL RELEASE (Left) RELEASE TRIGGER FINGER/A-1 PULLEY LEFT LONG FINGER (Left)  Patient Location: PACU  Anesthesia Type:General  Level of Consciousness: awake, alert , oriented and patient cooperative  Airway and Oxygen Therapy: Patient Spontanous Breathing  Post-op Pain: none  Post-op Assessment: Post-op Vital signs reviewed, Patient's Cardiovascular Status Stable, Respiratory Function Stable, Patent Airway, No signs of Nausea or vomiting and Pain level controlled  Post-op Vital Signs: Reviewed and stable  Complications: No apparent anesthesia complications

## 2012-09-19 NOTE — Brief Op Note (Signed)
09/19/2012  10:12 AM  PATIENT:  Adam Mata  72 y.o. 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  PROCEDURE:  Procedure(s): LEFT CARPAL TUNNEL RELEASE (Left) RELEASE TRIGGER FINGER/A-1 PULLEY LEFT LONG FINGER (Left)  SURGEON:  Surgeon(s) and Role:    * Wyn Forster., MD - Primary  PHYSICIAN ASSISTANT:   ASSISTANTS: Surgical technician  ANESTHESIA:   general  EBL:  Total I/O In: 700 [I.V.:700] Out: -   BLOOD ADMINISTERED:none  DRAINS: none   LOCAL MEDICATIONS USED:  LIDOCAINE   SPECIMEN:  No Specimen  DISPOSITION OF SPECIMEN:  N/A  COUNTS:  YES  TOURNIQUET:  * Missing tourniquet times found for documented tourniquets in log:  [id number] *  DICTATION: .Other Dictation: Dictation Number [id number]  PLAN OF CARE: Discharge to home after PACU  PATIENT DISPOSITION:  PACU - hemodynamically stable.   Delay start of Pharmacological VTE agent (>24hrs) due to surgical blood loss or risk of bleeding: not applicable

## 2012-09-19 NOTE — Interval H&P Note (Signed)
History and Physical Interval Note: [name] has been reexamined and there is no significant change in his history physical diagnosis and treatment plan.  09/19/2012 8:27 AM  Adam Mata  has presented today for surgery, with the diagnosis of LEFT CARPAL TUNNEL SYNDROME  The various methods of treatment have been discussed with the patient and family. After consideration of risks, benefits and other options for treatment, the patient has consented to  Procedure(s): LEFT CARPAL TUNNEL RELEASE (Left) as a surgical intervention .  The patient's history has been reviewed, patient examined, no change in status, stable for surgery.  I have reviewed the patient's chart and labs.  Questions were answered to the patient's satisfaction.     Annalynne Ibanez JR,Anes Rigel V

## 2012-09-19 NOTE — Anesthesia Preprocedure Evaluation (Addendum)
Anesthesia Evaluation  Patient identified by MRN, date of birth, ID band Patient awake and Patient confused    Reviewed: Allergy & Precautions, H&P , NPO status , Patient's Chart, lab work & pertinent test results, reviewed documented beta blocker date and time   History of Anesthesia Complications Negative for: history of anesthetic complications  Airway Mallampati: II TM Distance: >3 FB Neck ROM: Full    Dental  (+) Caps and Dental Advisory Given   Pulmonary neg pulmonary ROS,  breath sounds clear to auscultation  Pulmonary exam normal       Cardiovascular negative cardio ROS  Rhythm:Regular Rate:Normal     Neuro/Psych negative neurological ROS     GI/Hepatic negative GI ROS, Neg liver ROS,   Endo/Other  diabetes, Well Controlled, Type 2, Oral Hypoglycemic AgentsMorbid obesity  Renal/GU negative Renal ROS     Musculoskeletal   Abdominal (+) + obese,   Peds  Hematology   Anesthesia Other Findings   Reproductive/Obstetrics                          Anesthesia Physical Anesthesia Plan  ASA: III  Anesthesia Plan: General   Post-op Pain Management:    Induction: Intravenous  Airway Management Planned: LMA  Additional Equipment:   Intra-op Plan:   Post-operative Plan:   Informed Consent: I have reviewed the patients History and Physical, chart, labs and discussed the procedure including the risks, benefits and alternatives for the proposed anesthesia with the patient or authorized representative who has indicated his/her understanding and acceptance.   Dental advisory given  Plan Discussed with: CRNA and Surgeon  Anesthesia Plan Comments: (Plan routine monitors, GA- LMA OK)        Anesthesia Quick Evaluation

## 2012-09-19 NOTE — Transfer of Care (Signed)
Immediate Anesthesia Transfer of Care Note  Patient: Adam Mata  Procedure(s) Performed: Procedure(s): LEFT CARPAL TUNNEL RELEASE (Left) RELEASE TRIGGER FINGER/A-1 PULLEY LEFT LONG FINGER (Left)  Patient Location: PACU  Anesthesia Type:General  Level of Consciousness: awake, alert  and oriented  Airway & Oxygen Therapy: Patient Spontanous Breathing and Patient connected to face mask oxygen  Post-op Assessment: Report given to PACU RN, Post -op Vital signs reviewed and stable and Patient moving all extremities  Post vital signs: Reviewed and stable  Complications: No apparent anesthesia complications

## 2012-09-19 NOTE — H&P (View-Only) (Signed)
  Kobee Medlen is an 72 y.o. male.   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 He has had a history of bilateral hand numbness and discomfort. He receives quite a bit of healthcare at the [place]. He was partially worked up for carpal tunnel syndrome and had electrodiagnostic studies ultimately with Stephannie Li in [place] demonstrating severe bilateral carpal tunnel syndrome. His conduction velocity across the left elbow of the ulnar nerve was slow but not frankly pathologic. He does not note numbness in the small fingers bilaterally.    Past Medical History  Diagnosis Date  . Diabetes mellitus without complication   . Arthritis   . Carpal tunnel syndrome   . Gout     Past Surgical History  Procedure Laterality Date  . Tonsillectomy    . Colonoscopy    . Mouth surgery    . Carpal tunnel release Right 07/25/2012    Procedure: CARPAL TUNNEL RELEASE;  Surgeon: Wyn Forster., MD;  Location: [facility];  Service: Orthopedics;  Laterality: Right;    History reviewed. No pertinent family history. Social History:  reports that he has never smoked. He does not have any smokeless tobacco history on file. He reports that he does not drink alcohol or use illicit drugs.  Allergies: No Known Allergies  No prescriptions prior to admission    No results found for this or any previous visit (from the past 48 hour(s)).  No results found.   Pertinent items are noted in HPI.  Height 5\' 8"  (1.727 m), weight 113.399 kg (250 lb).  General appearance: alert Head: Normocephalic, without obvious abnormality Neck: supple, symmetrical, trachea midline Resp: clear to auscultation bilaterally Cardio: regular rate and rhythm GI: normal findings: bowel sounds normal Extremities:  Inspection of his hands reveals no thenar atrophy. His sweat patterns are  diminished in the median distribution bilaterally. His dermatoglyphics are preserved in median and ulnar. He has a negative elbow hyperflexion test. He has positive provocative signs of carpal tunnel syndrome. He has no sign of STS at this time but reports he has had some occasional triggering of his fingers in the past.   His electrodiagnostic studies performed by Dr. Cathrine Muster are reviewed in detail. He has severe bilateral carpal tunnel syndrome left worse than right. He has positive electrodiagnostic studies with polyphasics in the abductor pollicis brevis bilaterally.  Pulses: 2+ and symmetric Skin: normal Neurologic: Grossly normal    Assessment/Plan Impression: Left CTS  Plan: TO the OR for left CTR.The procedure, risks,benefits and post-op course were discussed with the patient at length and they were in agreement with the plan.  DASNOIT,Alise Calais J 09/04/2012, 3:56 PM   H&P documentation: 09/05/2012  -History and Physical Reviewed  -Patient has been re-examined  -No change in the plan of care  Wyn Forster, MD

## 2012-09-19 NOTE — Interval H&P Note (Signed)
History and Physical Interval Note: Examination in the holding area revealed a locking left long trigger finger. [name] requests that we also addressed the left long trigger finger with release of the A1 pulley. Procedure of release of the A1 pulley was added to the surgical permit.  09/19/2012 9:22 AM  Adam Mata  has presented today for surgery, with the diagnosis of LEFT CARPAL TUNNEL SYNDROME  The various methods of treatment have been discussed with the patient and family. After consideration of risks, benefits and other options for treatment, the patient has consented to  Procedure(s): LEFT CARPAL TUNNEL RELEASE (Left) as a surgical intervention .  The patient's history has been reviewed, patient examined, no change in status, stable for surgery.  I have reviewed the patient's chart and labs.  Questions were answered to the patient's satisfaction.     Larrie Lucia JR,Alger Kerstein V

## 2012-09-19 NOTE — Op Note (Signed)
[id number] 

## 2012-09-20 ENCOUNTER — Encounter (HOSPITAL_BASED_OUTPATIENT_CLINIC_OR_DEPARTMENT_OTHER): Payer: Self-pay | Admitting: Orthopedic Surgery

## 2012-09-20 NOTE — Op Note (Signed)
NAME:  Adam Mata, Adam Mata                   ACCOUNT NO.:  MEDICAL RECORD NO.:  [id number]  LOCATION:                                 FACILITY:  PHYSICIAN:  Katy Fitch. Taria Castrillo, M.D.      DATE OF BIRTH:  DATE OF PROCEDURE:  09/19/2012 DATE OF DISCHARGE:                              OPERATIVE REPORT   PREOPERATIVE DIAGNOSIS: 1. Chronic left carpal tunnel syndrome. 2. 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  POSTOPERATIVE DIAGNOSIS: 1. Chronic left carpal tunnel syndrome. 2. 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  OPERATION: 1. Release of left transverse carpal ligament. 2. Release of left long finger A1 pulley through separate incision .  SURGEON:  Katy Fitch. Zeno Hickel, MD.  ASSISTANT:  Surgical technician.  ANESTHESIA:  General by LMA.  SUPERVISING ANESTHESIOLOGIST:  Dr. Jean Rosenthal.  INDICATIONS:  Adam Mata is a 72 year old retired gentleman, who read by Dr. Samuel Germany of [facility] for management of carpal tunnel syndrome and triggering of the left long finger.  He is status post prior right carpal tunnel release and now returns for release of the left transverse carpal ligament.  Clinical examination in the holding area revealed a locking left long finger at the A1 pulley.  He requested that we proceed with release of the pulley in addition to his carpal tunnel release at this time.  Preoperatively, the procedures potential risks and benefits, expected aftercare and medication questions were invited and answered in detail. Dr. Jean Rosenthal of anesthesia provided detailed anesthesia informed consent. Adam Mata ultimately excepted general anesthesia by LMA technique.  He was noted to have background medical problems including non insulin- dependent diabetes mellitus, hypertension, and gout.  PROCEDURE IN DETAIL:  Adam Mata was brought to room #6 of the [facility] and placed supine position on the  operating table.  His proper surgical sites had been identified in the holding area per protocol with marking pen.  Under Dr. Jean Rosenthal supervision in room 6 general anesthesia by LMA technique was induced followed by routine Betadine scrub and paint of the left upper extremity.  A routine surgical time-out was accomplished followed by exsanguination of left arm with Esmarch bandage and inflation of arterial tourniquet to 220 mmHg.  Procedure commenced with a short oblique incision in the distal palmar crease exposing the A1 pulley of the left long finger.  The pulley was noted to be quite swollen invested with fibrotic tenosynovium.  After blunt Ragnell retractors were placed.  The pulley was released with scalpel scissors.  The flexor tendons delivered and found to be moderately necrotic.  Flexor tendons were cleaned with a rongeur and a tenotomy scissors used to remove ragged fragments the tendon.  Thereafter full passive motion of the long finger was recovered.  The wound was then repaired with intradermal 3-0 Prolene suture and Steri-Strips.  Attention was then directed to the proximal palm.  A short incision was fashioned in line of the ring finger in the palm. Subcutaneous tissues were carefully divided along the palmar fascia. The fascia was split longitudinally to reveal the common sensory branch of the median nerve and superficial  palmar arch.  The distal margin of the transverse carpal ligament were isolated with a Penfield 4 elevator followed by sequential release of the transverse carpal ligament proximally extending into the distal forearm.  This widely opened carpal canal.  No mass.  With the concern noted.  The volar forearm fascia was likewise released with scissors into the distal forearm.  Bleeding points along the margin of the released ligament were electrocauterized with bipolar current followed by repair of the skin with intradermal 3-0 Prolene suture.  For  aftercare, Ms. Marinello is provided prescription for Percocet 5 mg 1 p.o. q.4-6 hours p.r.n. pain, 20 tablets without refill.  We will see him back for followup in our office for recheck in 1 week at which time we will remove his dressing sutures begin active range of motion exercises and explained the postoperative rehab program.     Katy Fitch. Bethanny Toelle, M.D.     RVS/MEDQ  D:  09/19/2012  T:  09/19/2012  Job:  [id number]

## 2012-09-22 MED ORDER — POVIDONE-IODINE 10 % EX OINT
TOPICAL_OINTMENT | CUTANEOUS | Status: AC
  Filled 2012-09-22: qty 28.35

## 2013-12-28 DIAGNOSIS — Z0000 Encounter for general adult medical examination without abnormal findings: Secondary | ICD-10-CM | POA: Diagnosis not present

## 2013-12-28 DIAGNOSIS — E119 Type 2 diabetes mellitus without complications: Secondary | ICD-10-CM | POA: Diagnosis not present

## 2013-12-28 DIAGNOSIS — Z23 Encounter for immunization: Secondary | ICD-10-CM | POA: Diagnosis not present

## 2013-12-28 DIAGNOSIS — M109 Gout, unspecified: Secondary | ICD-10-CM | POA: Diagnosis not present

## 2013-12-28 DIAGNOSIS — R7309 Other abnormal glucose: Secondary | ICD-10-CM | POA: Diagnosis not present

## 2013-12-28 DIAGNOSIS — Z125 Encounter for screening for malignant neoplasm of prostate: Secondary | ICD-10-CM | POA: Diagnosis not present

## 2013-12-28 DIAGNOSIS — R740 Nonspecific elevation of levels of transaminase and lactic acid dehydrogenase [LDH]: Secondary | ICD-10-CM | POA: Diagnosis not present

## 2013-12-28 DIAGNOSIS — E785 Hyperlipidemia, unspecified: Secondary | ICD-10-CM | POA: Diagnosis not present

## 2014-09-06 DIAGNOSIS — N23 Unspecified renal colic: Secondary | ICD-10-CM | POA: Diagnosis not present

## 2014-09-06 DIAGNOSIS — N202 Calculus of kidney with calculus of ureter: Secondary | ICD-10-CM | POA: Diagnosis not present

## 2014-09-06 DIAGNOSIS — K760 Fatty (change of) liver, not elsewhere classified: Secondary | ICD-10-CM | POA: Diagnosis not present

## 2014-09-06 DIAGNOSIS — K8020 Calculus of gallbladder without cholecystitis without obstruction: Secondary | ICD-10-CM | POA: Diagnosis not present

## 2014-09-06 DIAGNOSIS — N200 Calculus of kidney: Secondary | ICD-10-CM | POA: Diagnosis not present

## 2014-09-06 DIAGNOSIS — N289 Disorder of kidney and ureter, unspecified: Secondary | ICD-10-CM | POA: Diagnosis not present

## 2014-09-06 DIAGNOSIS — E119 Type 2 diabetes mellitus without complications: Secondary | ICD-10-CM | POA: Diagnosis present

## 2014-09-06 DIAGNOSIS — N132 Hydronephrosis with renal and ureteral calculous obstruction: Secondary | ICD-10-CM | POA: Diagnosis not present

## 2014-09-06 DIAGNOSIS — N201 Calculus of ureter: Secondary | ICD-10-CM | POA: Diagnosis not present

## 2014-09-06 DIAGNOSIS — Z87442 Personal history of urinary calculi: Secondary | ICD-10-CM | POA: Diagnosis not present

## 2014-09-07 DIAGNOSIS — N201 Calculus of ureter: Secondary | ICD-10-CM | POA: Diagnosis not present

## 2014-09-10 DIAGNOSIS — N23 Unspecified renal colic: Secondary | ICD-10-CM | POA: Diagnosis not present

## 2014-09-10 DIAGNOSIS — N1330 Unspecified hydronephrosis: Secondary | ICD-10-CM | POA: Diagnosis not present

## 2014-09-10 DIAGNOSIS — N3090 Cystitis, unspecified without hematuria: Secondary | ICD-10-CM | POA: Diagnosis not present

## 2014-09-10 DIAGNOSIS — N201 Calculus of ureter: Secondary | ICD-10-CM | POA: Diagnosis not present

## 2014-09-10 DIAGNOSIS — N200 Calculus of kidney: Secondary | ICD-10-CM | POA: Diagnosis not present

## 2014-09-11 DIAGNOSIS — Z01810 Encounter for preprocedural cardiovascular examination: Secondary | ICD-10-CM | POA: Diagnosis not present

## 2014-09-11 DIAGNOSIS — Z79899 Other long term (current) drug therapy: Secondary | ICD-10-CM | POA: Diagnosis not present

## 2014-09-11 DIAGNOSIS — N200 Calculus of kidney: Secondary | ICD-10-CM | POA: Diagnosis not present

## 2014-09-11 DIAGNOSIS — E669 Obesity, unspecified: Secondary | ICD-10-CM | POA: Diagnosis not present

## 2014-09-11 DIAGNOSIS — E119 Type 2 diabetes mellitus without complications: Secondary | ICD-10-CM | POA: Diagnosis not present

## 2014-09-12 DIAGNOSIS — N200 Calculus of kidney: Secondary | ICD-10-CM | POA: Diagnosis not present

## 2014-09-12 DIAGNOSIS — N1330 Unspecified hydronephrosis: Secondary | ICD-10-CM | POA: Diagnosis not present

## 2014-09-12 DIAGNOSIS — N1339 Other hydronephrosis: Secondary | ICD-10-CM | POA: Diagnosis not present

## 2014-09-12 DIAGNOSIS — N281 Cyst of kidney, acquired: Secondary | ICD-10-CM | POA: Diagnosis not present

## 2014-09-13 DIAGNOSIS — N200 Calculus of kidney: Secondary | ICD-10-CM | POA: Diagnosis not present

## 2014-09-13 DIAGNOSIS — E669 Obesity, unspecified: Secondary | ICD-10-CM | POA: Diagnosis not present

## 2014-09-13 DIAGNOSIS — N202 Calculus of kidney with calculus of ureter: Secondary | ICD-10-CM | POA: Diagnosis not present

## 2014-09-13 DIAGNOSIS — Z79899 Other long term (current) drug therapy: Secondary | ICD-10-CM | POA: Diagnosis not present

## 2014-09-13 DIAGNOSIS — E119 Type 2 diabetes mellitus without complications: Secondary | ICD-10-CM | POA: Diagnosis not present

## 2014-09-24 DIAGNOSIS — N1330 Unspecified hydronephrosis: Secondary | ICD-10-CM | POA: Diagnosis not present

## 2014-09-24 DIAGNOSIS — E119 Type 2 diabetes mellitus without complications: Secondary | ICD-10-CM | POA: Diagnosis not present

## 2014-09-24 DIAGNOSIS — N200 Calculus of kidney: Secondary | ICD-10-CM | POA: Diagnosis not present

## 2014-09-24 DIAGNOSIS — N23 Unspecified renal colic: Secondary | ICD-10-CM | POA: Diagnosis not present

## 2014-10-11 DIAGNOSIS — N200 Calculus of kidney: Secondary | ICD-10-CM | POA: Diagnosis not present

## 2014-10-11 DIAGNOSIS — N202 Calculus of kidney with calculus of ureter: Secondary | ICD-10-CM | POA: Diagnosis not present

## 2014-10-11 DIAGNOSIS — E119 Type 2 diabetes mellitus without complications: Secondary | ICD-10-CM | POA: Diagnosis not present

## 2014-10-11 DIAGNOSIS — Z01818 Encounter for other preprocedural examination: Secondary | ICD-10-CM | POA: Diagnosis not present

## 2014-10-11 DIAGNOSIS — E669 Obesity, unspecified: Secondary | ICD-10-CM | POA: Diagnosis not present

## 2014-10-11 DIAGNOSIS — Z6836 Body mass index (BMI) 36.0-36.9, adult: Secondary | ICD-10-CM | POA: Diagnosis not present

## 2014-10-18 DIAGNOSIS — N200 Calculus of kidney: Secondary | ICD-10-CM | POA: Diagnosis not present

## 2014-10-18 DIAGNOSIS — N23 Unspecified renal colic: Secondary | ICD-10-CM | POA: Diagnosis not present

## 2014-10-18 DIAGNOSIS — N1330 Unspecified hydronephrosis: Secondary | ICD-10-CM | POA: Diagnosis not present

## 2014-10-18 DIAGNOSIS — N3090 Cystitis, unspecified without hematuria: Secondary | ICD-10-CM | POA: Diagnosis not present

## 2014-11-19 DIAGNOSIS — N3090 Cystitis, unspecified without hematuria: Secondary | ICD-10-CM | POA: Diagnosis not present

## 2014-11-19 DIAGNOSIS — N1330 Unspecified hydronephrosis: Secondary | ICD-10-CM | POA: Diagnosis not present

## 2014-11-19 DIAGNOSIS — N200 Calculus of kidney: Secondary | ICD-10-CM | POA: Diagnosis not present

## 2014-11-19 DIAGNOSIS — N23 Unspecified renal colic: Secondary | ICD-10-CM | POA: Diagnosis not present

## 2014-11-29 DIAGNOSIS — H26491 Other secondary cataract, right eye: Secondary | ICD-10-CM | POA: Diagnosis not present

## 2015-01-21 DIAGNOSIS — N200 Calculus of kidney: Secondary | ICD-10-CM | POA: Diagnosis not present

## 2015-01-21 DIAGNOSIS — N3000 Acute cystitis without hematuria: Secondary | ICD-10-CM | POA: Diagnosis not present

## 2015-01-28 DIAGNOSIS — E119 Type 2 diabetes mellitus without complications: Secondary | ICD-10-CM | POA: Diagnosis not present

## 2015-01-28 DIAGNOSIS — Z23 Encounter for immunization: Secondary | ICD-10-CM | POA: Diagnosis not present

## 2015-01-28 DIAGNOSIS — Z0000 Encounter for general adult medical examination without abnormal findings: Secondary | ICD-10-CM | POA: Diagnosis not present

## 2015-01-28 DIAGNOSIS — E785 Hyperlipidemia, unspecified: Secondary | ICD-10-CM | POA: Diagnosis not present

## 2015-01-28 DIAGNOSIS — M109 Gout, unspecified: Secondary | ICD-10-CM | POA: Diagnosis not present

## 2015-01-28 DIAGNOSIS — M25561 Pain in right knee: Secondary | ICD-10-CM | POA: Diagnosis not present

## 2015-01-28 DIAGNOSIS — Z125 Encounter for screening for malignant neoplasm of prostate: Secondary | ICD-10-CM | POA: Diagnosis not present

## 2015-01-28 DIAGNOSIS — D489 Neoplasm of uncertain behavior, unspecified: Secondary | ICD-10-CM | POA: Diagnosis not present

## 2015-01-28 DIAGNOSIS — Z6836 Body mass index (BMI) 36.0-36.9, adult: Secondary | ICD-10-CM | POA: Diagnosis not present

## 2015-01-28 DIAGNOSIS — E1121 Type 2 diabetes mellitus with diabetic nephropathy: Secondary | ICD-10-CM | POA: Diagnosis not present

## 2015-01-30 DIAGNOSIS — D1801 Hemangioma of skin and subcutaneous tissue: Secondary | ICD-10-CM | POA: Diagnosis not present

## 2015-01-30 DIAGNOSIS — L739 Follicular disorder, unspecified: Secondary | ICD-10-CM | POA: Diagnosis not present

## 2015-01-30 DIAGNOSIS — L578 Other skin changes due to chronic exposure to nonionizing radiation: Secondary | ICD-10-CM | POA: Diagnosis not present

## 2015-01-30 DIAGNOSIS — L570 Actinic keratosis: Secondary | ICD-10-CM | POA: Diagnosis not present

## 2015-01-30 DIAGNOSIS — L821 Other seborrheic keratosis: Secondary | ICD-10-CM | POA: Diagnosis not present

## 2015-04-29 DIAGNOSIS — E669 Obesity, unspecified: Secondary | ICD-10-CM | POA: Diagnosis not present

## 2015-04-29 DIAGNOSIS — E785 Hyperlipidemia, unspecified: Secondary | ICD-10-CM | POA: Diagnosis not present

## 2015-04-29 DIAGNOSIS — M109 Gout, unspecified: Secondary | ICD-10-CM | POA: Diagnosis not present

## 2015-04-29 DIAGNOSIS — Z6835 Body mass index (BMI) 35.0-35.9, adult: Secondary | ICD-10-CM | POA: Diagnosis not present

## 2015-04-29 DIAGNOSIS — E119 Type 2 diabetes mellitus without complications: Secondary | ICD-10-CM | POA: Diagnosis not present

## 2015-07-22 DIAGNOSIS — N200 Calculus of kidney: Secondary | ICD-10-CM | POA: Diagnosis not present

## 2015-07-22 DIAGNOSIS — N23 Unspecified renal colic: Secondary | ICD-10-CM | POA: Diagnosis not present

## 2015-07-22 DIAGNOSIS — N3020 Other chronic cystitis without hematuria: Secondary | ICD-10-CM | POA: Diagnosis not present

## 2015-08-08 DIAGNOSIS — E785 Hyperlipidemia, unspecified: Secondary | ICD-10-CM | POA: Diagnosis not present

## 2015-08-08 DIAGNOSIS — Z6835 Body mass index (BMI) 35.0-35.9, adult: Secondary | ICD-10-CM | POA: Diagnosis not present

## 2015-08-08 DIAGNOSIS — E119 Type 2 diabetes mellitus without complications: Secondary | ICD-10-CM | POA: Diagnosis not present

## 2015-08-08 DIAGNOSIS — E1121 Type 2 diabetes mellitus with diabetic nephropathy: Secondary | ICD-10-CM | POA: Diagnosis not present

## 2015-10-21 DIAGNOSIS — E119 Type 2 diabetes mellitus without complications: Secondary | ICD-10-CM | POA: Diagnosis not present

## 2015-11-10 DIAGNOSIS — I10 Essential (primary) hypertension: Secondary | ICD-10-CM | POA: Diagnosis not present

## 2015-11-10 DIAGNOSIS — Z6835 Body mass index (BMI) 35.0-35.9, adult: Secondary | ICD-10-CM | POA: Diagnosis not present

## 2015-11-10 DIAGNOSIS — E785 Hyperlipidemia, unspecified: Secondary | ICD-10-CM | POA: Diagnosis not present

## 2015-11-10 DIAGNOSIS — E119 Type 2 diabetes mellitus without complications: Secondary | ICD-10-CM | POA: Diagnosis not present

## 2015-11-10 DIAGNOSIS — E1121 Type 2 diabetes mellitus with diabetic nephropathy: Secondary | ICD-10-CM | POA: Diagnosis not present

## 2016-01-29 DIAGNOSIS — N3020 Other chronic cystitis without hematuria: Secondary | ICD-10-CM | POA: Diagnosis not present

## 2016-01-29 DIAGNOSIS — N200 Calculus of kidney: Secondary | ICD-10-CM | POA: Diagnosis not present

## 2016-02-12 DIAGNOSIS — Z6835 Body mass index (BMI) 35.0-35.9, adult: Secondary | ICD-10-CM | POA: Diagnosis not present

## 2016-02-12 DIAGNOSIS — J019 Acute sinusitis, unspecified: Secondary | ICD-10-CM | POA: Diagnosis not present

## 2016-02-12 DIAGNOSIS — Z9181 History of falling: Secondary | ICD-10-CM | POA: Diagnosis not present

## 2016-02-12 DIAGNOSIS — E1121 Type 2 diabetes mellitus with diabetic nephropathy: Secondary | ICD-10-CM | POA: Diagnosis not present

## 2016-02-12 DIAGNOSIS — Z1389 Encounter for screening for other disorder: Secondary | ICD-10-CM | POA: Diagnosis not present

## 2016-02-12 DIAGNOSIS — E669 Obesity, unspecified: Secondary | ICD-10-CM | POA: Diagnosis not present

## 2016-02-12 DIAGNOSIS — M109 Gout, unspecified: Secondary | ICD-10-CM | POA: Diagnosis not present

## 2016-02-12 DIAGNOSIS — E119 Type 2 diabetes mellitus without complications: Secondary | ICD-10-CM | POA: Diagnosis not present

## 2016-02-12 DIAGNOSIS — J301 Allergic rhinitis due to pollen: Secondary | ICD-10-CM | POA: Diagnosis not present

## 2016-02-12 DIAGNOSIS — E785 Hyperlipidemia, unspecified: Secondary | ICD-10-CM | POA: Diagnosis not present

## 2016-03-11 DIAGNOSIS — Z23 Encounter for immunization: Secondary | ICD-10-CM | POA: Diagnosis not present

## 2016-05-12 DIAGNOSIS — E785 Hyperlipidemia, unspecified: Secondary | ICD-10-CM | POA: Diagnosis not present

## 2016-05-12 DIAGNOSIS — Z125 Encounter for screening for malignant neoplasm of prostate: Secondary | ICD-10-CM | POA: Diagnosis not present

## 2016-05-12 DIAGNOSIS — E119 Type 2 diabetes mellitus without complications: Secondary | ICD-10-CM | POA: Diagnosis not present

## 2016-05-12 DIAGNOSIS — E1121 Type 2 diabetes mellitus with diabetic nephropathy: Secondary | ICD-10-CM | POA: Diagnosis not present

## 2016-05-12 DIAGNOSIS — M109 Gout, unspecified: Secondary | ICD-10-CM | POA: Diagnosis not present

## 2016-05-12 DIAGNOSIS — J301 Allergic rhinitis due to pollen: Secondary | ICD-10-CM | POA: Diagnosis not present

## 2016-05-12 DIAGNOSIS — Z6836 Body mass index (BMI) 36.0-36.9, adult: Secondary | ICD-10-CM | POA: Diagnosis not present

## 2016-07-29 DIAGNOSIS — N200 Calculus of kidney: Secondary | ICD-10-CM | POA: Diagnosis not present

## 2016-07-29 DIAGNOSIS — N3020 Other chronic cystitis without hematuria: Secondary | ICD-10-CM | POA: Diagnosis not present

## 2016-08-12 DIAGNOSIS — E669 Obesity, unspecified: Secondary | ICD-10-CM | POA: Diagnosis not present

## 2016-08-12 DIAGNOSIS — Z23 Encounter for immunization: Secondary | ICD-10-CM | POA: Diagnosis not present

## 2016-08-12 DIAGNOSIS — M109 Gout, unspecified: Secondary | ICD-10-CM | POA: Diagnosis not present

## 2016-08-12 DIAGNOSIS — E119 Type 2 diabetes mellitus without complications: Secondary | ICD-10-CM | POA: Diagnosis not present

## 2016-08-12 DIAGNOSIS — E785 Hyperlipidemia, unspecified: Secondary | ICD-10-CM | POA: Diagnosis not present

## 2016-08-12 DIAGNOSIS — E1121 Type 2 diabetes mellitus with diabetic nephropathy: Secondary | ICD-10-CM | POA: Diagnosis not present

## 2016-08-12 DIAGNOSIS — J301 Allergic rhinitis due to pollen: Secondary | ICD-10-CM | POA: Diagnosis not present

## 2016-11-12 DIAGNOSIS — E1121 Type 2 diabetes mellitus with diabetic nephropathy: Secondary | ICD-10-CM | POA: Diagnosis not present

## 2016-11-12 DIAGNOSIS — Z6836 Body mass index (BMI) 36.0-36.9, adult: Secondary | ICD-10-CM | POA: Diagnosis not present

## 2016-11-12 DIAGNOSIS — M109 Gout, unspecified: Secondary | ICD-10-CM | POA: Diagnosis not present

## 2016-11-12 DIAGNOSIS — E785 Hyperlipidemia, unspecified: Secondary | ICD-10-CM | POA: Diagnosis not present

## 2016-11-12 DIAGNOSIS — Z23 Encounter for immunization: Secondary | ICD-10-CM | POA: Diagnosis not present

## 2016-11-12 DIAGNOSIS — E119 Type 2 diabetes mellitus without complications: Secondary | ICD-10-CM | POA: Diagnosis not present

## 2016-11-12 DIAGNOSIS — I10 Essential (primary) hypertension: Secondary | ICD-10-CM | POA: Diagnosis not present

## 2016-12-20 DIAGNOSIS — N1330 Unspecified hydronephrosis: Secondary | ICD-10-CM | POA: Diagnosis not present

## 2016-12-20 DIAGNOSIS — N201 Calculus of ureter: Secondary | ICD-10-CM | POA: Diagnosis not present

## 2016-12-20 DIAGNOSIS — N3020 Other chronic cystitis without hematuria: Secondary | ICD-10-CM | POA: Diagnosis not present

## 2016-12-21 DIAGNOSIS — Z7982 Long term (current) use of aspirin: Secondary | ICD-10-CM | POA: Diagnosis not present

## 2016-12-21 DIAGNOSIS — E669 Obesity, unspecified: Secondary | ICD-10-CM | POA: Diagnosis not present

## 2016-12-21 DIAGNOSIS — Z7984 Long term (current) use of oral hypoglycemic drugs: Secondary | ICD-10-CM | POA: Diagnosis not present

## 2016-12-21 DIAGNOSIS — N201 Calculus of ureter: Secondary | ICD-10-CM | POA: Diagnosis not present

## 2016-12-21 DIAGNOSIS — E119 Type 2 diabetes mellitus without complications: Secondary | ICD-10-CM | POA: Diagnosis not present

## 2016-12-21 DIAGNOSIS — N3020 Other chronic cystitis without hematuria: Secondary | ICD-10-CM | POA: Diagnosis not present

## 2016-12-21 DIAGNOSIS — Z79899 Other long term (current) drug therapy: Secondary | ICD-10-CM | POA: Diagnosis not present

## 2016-12-21 DIAGNOSIS — N1330 Unspecified hydronephrosis: Secondary | ICD-10-CM | POA: Diagnosis not present

## 2016-12-22 DIAGNOSIS — N209 Urinary calculus, unspecified: Secondary | ICD-10-CM | POA: Diagnosis not present

## 2016-12-28 DIAGNOSIS — N201 Calculus of ureter: Secondary | ICD-10-CM | POA: Diagnosis not present

## 2016-12-28 DIAGNOSIS — N3020 Other chronic cystitis without hematuria: Secondary | ICD-10-CM | POA: Diagnosis not present

## 2016-12-28 DIAGNOSIS — N1330 Unspecified hydronephrosis: Secondary | ICD-10-CM | POA: Diagnosis not present

## 2017-01-11 DIAGNOSIS — N201 Calculus of ureter: Secondary | ICD-10-CM | POA: Diagnosis not present

## 2017-01-11 DIAGNOSIS — N3090 Cystitis, unspecified without hematuria: Secondary | ICD-10-CM | POA: Diagnosis not present

## 2017-01-11 DIAGNOSIS — N3020 Other chronic cystitis without hematuria: Secondary | ICD-10-CM | POA: Diagnosis not present

## 2017-03-01 DIAGNOSIS — E1122 Type 2 diabetes mellitus with diabetic chronic kidney disease: Secondary | ICD-10-CM | POA: Insufficient documentation

## 2017-03-03 DIAGNOSIS — Z23 Encounter for immunization: Secondary | ICD-10-CM | POA: Diagnosis not present

## 2017-03-14 DIAGNOSIS — E785 Hyperlipidemia, unspecified: Secondary | ICD-10-CM | POA: Diagnosis not present

## 2017-03-14 DIAGNOSIS — E119 Type 2 diabetes mellitus without complications: Secondary | ICD-10-CM | POA: Diagnosis not present

## 2017-03-14 DIAGNOSIS — M109 Gout, unspecified: Secondary | ICD-10-CM | POA: Diagnosis not present

## 2017-03-14 DIAGNOSIS — Z6835 Body mass index (BMI) 35.0-35.9, adult: Secondary | ICD-10-CM | POA: Diagnosis not present

## 2017-03-14 DIAGNOSIS — I10 Essential (primary) hypertension: Secondary | ICD-10-CM | POA: Diagnosis not present

## 2017-06-16 DIAGNOSIS — Z961 Presence of intraocular lens: Secondary | ICD-10-CM | POA: Diagnosis not present

## 2017-06-16 DIAGNOSIS — E119 Type 2 diabetes mellitus without complications: Secondary | ICD-10-CM | POA: Diagnosis not present

## 2017-06-29 DIAGNOSIS — N3020 Other chronic cystitis without hematuria: Secondary | ICD-10-CM | POA: Diagnosis not present

## 2017-06-29 DIAGNOSIS — M545 Low back pain: Secondary | ICD-10-CM | POA: Diagnosis not present

## 2017-07-06 DIAGNOSIS — N3020 Other chronic cystitis without hematuria: Secondary | ICD-10-CM | POA: Diagnosis not present

## 2017-07-06 DIAGNOSIS — N401 Enlarged prostate with lower urinary tract symptoms: Secondary | ICD-10-CM | POA: Diagnosis not present

## 2017-07-06 DIAGNOSIS — R5383 Other fatigue: Secondary | ICD-10-CM | POA: Diagnosis not present

## 2017-07-06 DIAGNOSIS — N200 Calculus of kidney: Secondary | ICD-10-CM | POA: Diagnosis not present

## 2017-12-19 DIAGNOSIS — J019 Acute sinusitis, unspecified: Secondary | ICD-10-CM | POA: Diagnosis not present

## 2017-12-19 DIAGNOSIS — M109 Gout, unspecified: Secondary | ICD-10-CM | POA: Diagnosis not present

## 2017-12-19 DIAGNOSIS — E669 Obesity, unspecified: Secondary | ICD-10-CM | POA: Diagnosis not present

## 2017-12-19 DIAGNOSIS — B349 Viral infection, unspecified: Secondary | ICD-10-CM | POA: Diagnosis not present

## 2017-12-19 DIAGNOSIS — E119 Type 2 diabetes mellitus without complications: Secondary | ICD-10-CM | POA: Diagnosis not present

## 2017-12-19 DIAGNOSIS — E1121 Type 2 diabetes mellitus with diabetic nephropathy: Secondary | ICD-10-CM | POA: Diagnosis not present

## 2017-12-19 DIAGNOSIS — J029 Acute pharyngitis, unspecified: Secondary | ICD-10-CM | POA: Diagnosis not present

## 2017-12-19 DIAGNOSIS — E785 Hyperlipidemia, unspecified: Secondary | ICD-10-CM | POA: Diagnosis not present

## 2017-12-19 DIAGNOSIS — J301 Allergic rhinitis due to pollen: Secondary | ICD-10-CM | POA: Diagnosis not present

## 2017-12-28 DIAGNOSIS — N183 Chronic kidney disease, stage 3 (moderate): Secondary | ICD-10-CM | POA: Diagnosis not present

## 2018-01-20 DIAGNOSIS — Z23 Encounter for immunization: Secondary | ICD-10-CM | POA: Diagnosis not present

## 2018-01-22 DIAGNOSIS — N200 Calculus of kidney: Secondary | ICD-10-CM | POA: Diagnosis not present

## 2018-01-23 DIAGNOSIS — N200 Calculus of kidney: Secondary | ICD-10-CM | POA: Diagnosis not present

## 2018-03-01 DIAGNOSIS — A4902 Methicillin resistant Staphylococcus aureus infection, unspecified site: Secondary | ICD-10-CM

## 2018-03-01 HISTORY — DX: Methicillin resistant Staphylococcus aureus infection, unspecified site: A49.02

## 2018-03-07 DIAGNOSIS — N183 Chronic kidney disease, stage 3 (moderate): Secondary | ICD-10-CM | POA: Diagnosis not present

## 2018-03-07 DIAGNOSIS — N200 Calculus of kidney: Secondary | ICD-10-CM | POA: Diagnosis not present

## 2018-03-07 DIAGNOSIS — E1122 Type 2 diabetes mellitus with diabetic chronic kidney disease: Secondary | ICD-10-CM | POA: Diagnosis not present

## 2018-06-20 DIAGNOSIS — H26491 Other secondary cataract, right eye: Secondary | ICD-10-CM | POA: Diagnosis not present

## 2018-06-20 DIAGNOSIS — H25812 Combined forms of age-related cataract, left eye: Secondary | ICD-10-CM | POA: Diagnosis not present

## 2018-06-20 DIAGNOSIS — Z961 Presence of intraocular lens: Secondary | ICD-10-CM | POA: Diagnosis not present

## 2018-06-20 DIAGNOSIS — E119 Type 2 diabetes mellitus without complications: Secondary | ICD-10-CM | POA: Diagnosis not present

## 2018-06-29 DIAGNOSIS — E119 Type 2 diabetes mellitus without complications: Secondary | ICD-10-CM | POA: Diagnosis not present

## 2018-06-29 DIAGNOSIS — M25511 Pain in right shoulder: Secondary | ICD-10-CM | POA: Diagnosis not present

## 2018-06-29 DIAGNOSIS — Z9181 History of falling: Secondary | ICD-10-CM | POA: Diagnosis not present

## 2018-06-29 DIAGNOSIS — N200 Calculus of kidney: Secondary | ICD-10-CM | POA: Diagnosis not present

## 2018-06-29 DIAGNOSIS — Z1331 Encounter for screening for depression: Secondary | ICD-10-CM | POA: Diagnosis not present

## 2018-06-29 DIAGNOSIS — J301 Allergic rhinitis due to pollen: Secondary | ICD-10-CM | POA: Diagnosis not present

## 2018-10-09 DIAGNOSIS — E1121 Type 2 diabetes mellitus with diabetic nephropathy: Secondary | ICD-10-CM | POA: Diagnosis not present

## 2018-10-09 DIAGNOSIS — E785 Hyperlipidemia, unspecified: Secondary | ICD-10-CM | POA: Diagnosis not present

## 2018-10-09 DIAGNOSIS — N183 Chronic kidney disease, stage 3 (moderate): Secondary | ICD-10-CM | POA: Diagnosis not present

## 2018-10-09 DIAGNOSIS — Z125 Encounter for screening for malignant neoplasm of prostate: Secondary | ICD-10-CM | POA: Diagnosis not present

## 2018-10-09 DIAGNOSIS — N200 Calculus of kidney: Secondary | ICD-10-CM | POA: Diagnosis not present

## 2018-10-09 DIAGNOSIS — M25512 Pain in left shoulder: Secondary | ICD-10-CM | POA: Diagnosis not present

## 2018-10-09 DIAGNOSIS — E119 Type 2 diabetes mellitus without complications: Secondary | ICD-10-CM | POA: Diagnosis not present

## 2018-10-09 DIAGNOSIS — M109 Gout, unspecified: Secondary | ICD-10-CM | POA: Diagnosis not present

## 2018-11-02 DIAGNOSIS — N200 Calculus of kidney: Secondary | ICD-10-CM | POA: Diagnosis not present

## 2018-11-08 DIAGNOSIS — N183 Chronic kidney disease, stage 3 (moderate): Secondary | ICD-10-CM | POA: Diagnosis not present

## 2018-11-17 DIAGNOSIS — N183 Chronic kidney disease, stage 3 (moderate): Secondary | ICD-10-CM | POA: Diagnosis not present

## 2018-11-17 DIAGNOSIS — E1122 Type 2 diabetes mellitus with diabetic chronic kidney disease: Secondary | ICD-10-CM | POA: Diagnosis not present

## 2018-11-17 DIAGNOSIS — N200 Calculus of kidney: Secondary | ICD-10-CM | POA: Diagnosis not present

## 2018-12-14 DIAGNOSIS — J9811 Atelectasis: Secondary | ICD-10-CM | POA: Diagnosis not present

## 2018-12-14 DIAGNOSIS — M25512 Pain in left shoulder: Secondary | ICD-10-CM | POA: Diagnosis not present

## 2018-12-14 DIAGNOSIS — G8929 Other chronic pain: Secondary | ICD-10-CM | POA: Diagnosis not present

## 2018-12-14 DIAGNOSIS — M25551 Pain in right hip: Secondary | ICD-10-CM | POA: Diagnosis not present

## 2018-12-14 DIAGNOSIS — M67912 Unspecified disorder of synovium and tendon, left shoulder: Secondary | ICD-10-CM | POA: Insufficient documentation

## 2018-12-14 DIAGNOSIS — M25552 Pain in left hip: Secondary | ICD-10-CM | POA: Diagnosis not present

## 2018-12-14 DIAGNOSIS — M19012 Primary osteoarthritis, left shoulder: Secondary | ICD-10-CM | POA: Diagnosis not present

## 2018-12-14 DIAGNOSIS — M25511 Pain in right shoulder: Secondary | ICD-10-CM | POA: Insufficient documentation

## 2018-12-28 DIAGNOSIS — G8929 Other chronic pain: Secondary | ICD-10-CM | POA: Diagnosis not present

## 2018-12-28 DIAGNOSIS — M19012 Primary osteoarthritis, left shoulder: Secondary | ICD-10-CM | POA: Diagnosis not present

## 2018-12-28 DIAGNOSIS — M25512 Pain in left shoulder: Secondary | ICD-10-CM | POA: Diagnosis not present

## 2018-12-28 DIAGNOSIS — M7552 Bursitis of left shoulder: Secondary | ICD-10-CM | POA: Diagnosis not present

## 2018-12-28 DIAGNOSIS — M7522 Bicipital tendinitis, left shoulder: Secondary | ICD-10-CM | POA: Diagnosis not present

## 2018-12-28 DIAGNOSIS — S46012A Strain of muscle(s) and tendon(s) of the rotator cuff of left shoulder, initial encounter: Secondary | ICD-10-CM | POA: Diagnosis not present

## 2019-01-09 DIAGNOSIS — N183 Chronic kidney disease, stage 3 unspecified: Secondary | ICD-10-CM | POA: Diagnosis not present

## 2019-01-09 DIAGNOSIS — E119 Type 2 diabetes mellitus without complications: Secondary | ICD-10-CM | POA: Diagnosis not present

## 2019-01-09 DIAGNOSIS — E785 Hyperlipidemia, unspecified: Secondary | ICD-10-CM | POA: Diagnosis not present

## 2019-01-09 DIAGNOSIS — Z23 Encounter for immunization: Secondary | ICD-10-CM | POA: Diagnosis not present

## 2019-01-18 DIAGNOSIS — M25512 Pain in left shoulder: Secondary | ICD-10-CM | POA: Diagnosis not present

## 2019-01-18 DIAGNOSIS — M25511 Pain in right shoulder: Secondary | ICD-10-CM | POA: Diagnosis not present

## 2019-01-18 DIAGNOSIS — G8929 Other chronic pain: Secondary | ICD-10-CM | POA: Diagnosis not present

## 2019-01-18 DIAGNOSIS — M67912 Unspecified disorder of synovium and tendon, left shoulder: Secondary | ICD-10-CM | POA: Diagnosis not present

## 2019-01-23 DIAGNOSIS — D729 Disorder of white blood cells, unspecified: Secondary | ICD-10-CM | POA: Diagnosis not present

## 2019-03-08 DIAGNOSIS — J020 Streptococcal pharyngitis: Secondary | ICD-10-CM | POA: Diagnosis not present

## 2019-03-08 DIAGNOSIS — B349 Viral infection, unspecified: Secondary | ICD-10-CM | POA: Diagnosis not present

## 2019-03-08 DIAGNOSIS — Z20822 Contact with and (suspected) exposure to covid-19: Secondary | ICD-10-CM | POA: Diagnosis not present

## 2019-04-11 DIAGNOSIS — Z139 Encounter for screening, unspecified: Secondary | ICD-10-CM | POA: Diagnosis not present

## 2019-04-11 DIAGNOSIS — Z9181 History of falling: Secondary | ICD-10-CM | POA: Diagnosis not present

## 2019-04-11 DIAGNOSIS — E785 Hyperlipidemia, unspecified: Secondary | ICD-10-CM | POA: Diagnosis not present

## 2019-04-11 DIAGNOSIS — Z1331 Encounter for screening for depression: Secondary | ICD-10-CM | POA: Diagnosis not present

## 2019-04-11 DIAGNOSIS — E119 Type 2 diabetes mellitus without complications: Secondary | ICD-10-CM | POA: Diagnosis not present

## 2019-04-13 DIAGNOSIS — M67912 Unspecified disorder of synovium and tendon, left shoulder: Secondary | ICD-10-CM | POA: Diagnosis not present

## 2019-05-04 DIAGNOSIS — Z23 Encounter for immunization: Secondary | ICD-10-CM | POA: Diagnosis not present

## 2019-06-01 DIAGNOSIS — Z23 Encounter for immunization: Secondary | ICD-10-CM | POA: Diagnosis not present

## 2019-07-31 DIAGNOSIS — M109 Gout, unspecified: Secondary | ICD-10-CM | POA: Diagnosis not present

## 2019-07-31 DIAGNOSIS — E785 Hyperlipidemia, unspecified: Secondary | ICD-10-CM | POA: Diagnosis not present

## 2019-07-31 DIAGNOSIS — E119 Type 2 diabetes mellitus without complications: Secondary | ICD-10-CM | POA: Diagnosis not present

## 2019-07-31 DIAGNOSIS — J301 Allergic rhinitis due to pollen: Secondary | ICD-10-CM | POA: Diagnosis not present

## 2019-07-31 DIAGNOSIS — N183 Chronic kidney disease, stage 3 unspecified: Secondary | ICD-10-CM | POA: Diagnosis not present

## 2019-10-31 DIAGNOSIS — E785 Hyperlipidemia, unspecified: Secondary | ICD-10-CM | POA: Diagnosis not present

## 2019-10-31 DIAGNOSIS — Z6835 Body mass index (BMI) 35.0-35.9, adult: Secondary | ICD-10-CM | POA: Diagnosis not present

## 2019-10-31 DIAGNOSIS — N200 Calculus of kidney: Secondary | ICD-10-CM | POA: Diagnosis not present

## 2019-10-31 DIAGNOSIS — E119 Type 2 diabetes mellitus without complications: Secondary | ICD-10-CM | POA: Diagnosis not present

## 2019-10-31 DIAGNOSIS — M109 Gout, unspecified: Secondary | ICD-10-CM | POA: Diagnosis not present

## 2019-10-31 DIAGNOSIS — Z125 Encounter for screening for malignant neoplasm of prostate: Secondary | ICD-10-CM | POA: Diagnosis not present

## 2019-10-31 DIAGNOSIS — E1121 Type 2 diabetes mellitus with diabetic nephropathy: Secondary | ICD-10-CM | POA: Diagnosis not present

## 2019-11-08 DIAGNOSIS — H04129 Dry eye syndrome of unspecified lacrimal gland: Secondary | ICD-10-CM | POA: Diagnosis not present

## 2019-11-08 DIAGNOSIS — H02883 Meibomian gland dysfunction of right eye, unspecified eyelid: Secondary | ICD-10-CM | POA: Diagnosis not present

## 2019-11-08 DIAGNOSIS — E119 Type 2 diabetes mellitus without complications: Secondary | ICD-10-CM | POA: Diagnosis not present

## 2019-11-08 DIAGNOSIS — H25812 Combined forms of age-related cataract, left eye: Secondary | ICD-10-CM | POA: Diagnosis not present

## 2019-11-08 DIAGNOSIS — H35373 Puckering of macula, bilateral: Secondary | ICD-10-CM | POA: Diagnosis not present

## 2019-11-19 DIAGNOSIS — D691 Qualitative platelet defects: Secondary | ICD-10-CM | POA: Diagnosis not present

## 2019-11-21 DIAGNOSIS — D696 Thrombocytopenia, unspecified: Secondary | ICD-10-CM | POA: Insufficient documentation

## 2019-12-03 DIAGNOSIS — I10 Essential (primary) hypertension: Secondary | ICD-10-CM | POA: Diagnosis not present

## 2019-12-06 DIAGNOSIS — H35371 Puckering of macula, right eye: Secondary | ICD-10-CM | POA: Diagnosis not present

## 2019-12-24 DIAGNOSIS — N183 Chronic kidney disease, stage 3 unspecified: Secondary | ICD-10-CM | POA: Diagnosis not present

## 2020-01-02 DIAGNOSIS — N183 Chronic kidney disease, stage 3 unspecified: Secondary | ICD-10-CM | POA: Diagnosis not present

## 2020-01-07 DIAGNOSIS — N200 Calculus of kidney: Secondary | ICD-10-CM | POA: Diagnosis not present

## 2020-01-07 DIAGNOSIS — N183 Chronic kidney disease, stage 3 unspecified: Secondary | ICD-10-CM | POA: Diagnosis not present

## 2020-01-07 DIAGNOSIS — E1122 Type 2 diabetes mellitus with diabetic chronic kidney disease: Secondary | ICD-10-CM | POA: Diagnosis not present

## 2020-01-14 DIAGNOSIS — H35373 Puckering of macula, bilateral: Secondary | ICD-10-CM | POA: Diagnosis not present

## 2020-01-21 DIAGNOSIS — N1831 Chronic kidney disease, stage 3a: Secondary | ICD-10-CM | POA: Diagnosis not present

## 2020-01-21 DIAGNOSIS — N200 Calculus of kidney: Secondary | ICD-10-CM | POA: Diagnosis not present

## 2020-01-21 DIAGNOSIS — K8020 Calculus of gallbladder without cholecystitis without obstruction: Secondary | ICD-10-CM | POA: Diagnosis not present

## 2020-01-21 DIAGNOSIS — M109 Gout, unspecified: Secondary | ICD-10-CM | POA: Diagnosis not present

## 2020-01-21 DIAGNOSIS — K5900 Constipation, unspecified: Secondary | ICD-10-CM | POA: Diagnosis not present

## 2020-01-21 DIAGNOSIS — D696 Thrombocytopenia, unspecified: Secondary | ICD-10-CM | POA: Diagnosis not present

## 2020-01-21 DIAGNOSIS — N202 Calculus of kidney with calculus of ureter: Secondary | ICD-10-CM | POA: Diagnosis not present

## 2020-01-21 DIAGNOSIS — N201 Calculus of ureter: Secondary | ICD-10-CM | POA: Diagnosis not present

## 2020-01-21 DIAGNOSIS — N132 Hydronephrosis with renal and ureteral calculous obstruction: Secondary | ICD-10-CM | POA: Diagnosis not present

## 2020-01-21 DIAGNOSIS — E1122 Type 2 diabetes mellitus with diabetic chronic kidney disease: Secondary | ICD-10-CM | POA: Diagnosis not present

## 2020-01-21 DIAGNOSIS — N179 Acute kidney failure, unspecified: Secondary | ICD-10-CM | POA: Diagnosis not present

## 2020-01-21 DIAGNOSIS — R21 Rash and other nonspecific skin eruption: Secondary | ICD-10-CM | POA: Diagnosis not present

## 2020-01-30 DIAGNOSIS — N201 Calculus of ureter: Secondary | ICD-10-CM | POA: Diagnosis not present

## 2020-01-30 DIAGNOSIS — Z6834 Body mass index (BMI) 34.0-34.9, adult: Secondary | ICD-10-CM | POA: Diagnosis not present

## 2020-01-30 DIAGNOSIS — E785 Hyperlipidemia, unspecified: Secondary | ICD-10-CM | POA: Diagnosis not present

## 2020-01-30 DIAGNOSIS — E1121 Type 2 diabetes mellitus with diabetic nephropathy: Secondary | ICD-10-CM | POA: Diagnosis not present

## 2020-01-30 DIAGNOSIS — E119 Type 2 diabetes mellitus without complications: Secondary | ICD-10-CM | POA: Diagnosis not present

## 2020-01-30 DIAGNOSIS — M109 Gout, unspecified: Secondary | ICD-10-CM | POA: Diagnosis not present

## 2020-01-30 DIAGNOSIS — D649 Anemia, unspecified: Secondary | ICD-10-CM | POA: Diagnosis not present

## 2020-01-30 DIAGNOSIS — I10 Essential (primary) hypertension: Secondary | ICD-10-CM | POA: Diagnosis not present

## 2020-01-30 DIAGNOSIS — R21 Rash and other nonspecific skin eruption: Secondary | ICD-10-CM | POA: Diagnosis not present

## 2020-01-30 DIAGNOSIS — N183 Chronic kidney disease, stage 3 unspecified: Secondary | ICD-10-CM | POA: Diagnosis not present

## 2020-01-30 DIAGNOSIS — J301 Allergic rhinitis due to pollen: Secondary | ICD-10-CM | POA: Diagnosis not present

## 2020-02-05 DIAGNOSIS — N200 Calculus of kidney: Secondary | ICD-10-CM | POA: Diagnosis not present

## 2020-02-21 DIAGNOSIS — N200 Calculus of kidney: Secondary | ICD-10-CM | POA: Diagnosis not present

## 2020-02-21 DIAGNOSIS — T83192A Other mechanical complication of urinary stent, initial encounter: Secondary | ICD-10-CM | POA: Diagnosis not present

## 2020-03-06 DIAGNOSIS — L309 Dermatitis, unspecified: Secondary | ICD-10-CM | POA: Diagnosis not present

## 2020-03-06 DIAGNOSIS — L308 Other specified dermatitis: Secondary | ICD-10-CM | POA: Diagnosis not present

## 2020-03-06 DIAGNOSIS — L853 Xerosis cutis: Secondary | ICD-10-CM | POA: Diagnosis not present

## 2020-03-06 DIAGNOSIS — D485 Neoplasm of uncertain behavior of skin: Secondary | ICD-10-CM | POA: Diagnosis not present

## 2020-03-06 DIAGNOSIS — L578 Other skin changes due to chronic exposure to nonionizing radiation: Secondary | ICD-10-CM | POA: Diagnosis not present

## 2020-03-18 DIAGNOSIS — L309 Dermatitis, unspecified: Secondary | ICD-10-CM | POA: Diagnosis not present

## 2020-03-18 DIAGNOSIS — L299 Pruritus, unspecified: Secondary | ICD-10-CM | POA: Diagnosis not present

## 2020-03-18 DIAGNOSIS — L853 Xerosis cutis: Secondary | ICD-10-CM | POA: Diagnosis not present

## 2020-03-21 DIAGNOSIS — N183 Chronic kidney disease, stage 3 unspecified: Secondary | ICD-10-CM | POA: Diagnosis not present

## 2020-03-26 DIAGNOSIS — N183 Chronic kidney disease, stage 3 unspecified: Secondary | ICD-10-CM | POA: Diagnosis not present

## 2020-03-26 DIAGNOSIS — N200 Calculus of kidney: Secondary | ICD-10-CM | POA: Diagnosis not present

## 2020-03-26 DIAGNOSIS — E1122 Type 2 diabetes mellitus with diabetic chronic kidney disease: Secondary | ICD-10-CM | POA: Diagnosis not present

## 2020-04-01 DIAGNOSIS — L501 Idiopathic urticaria: Secondary | ICD-10-CM | POA: Diagnosis not present

## 2020-04-01 DIAGNOSIS — L0390 Cellulitis, unspecified: Secondary | ICD-10-CM | POA: Diagnosis not present

## 2020-04-03 DIAGNOSIS — N130 Hydronephrosis with ureteropelvic junction obstruction: Secondary | ICD-10-CM | POA: Diagnosis not present

## 2020-04-03 DIAGNOSIS — N200 Calculus of kidney: Secondary | ICD-10-CM | POA: Diagnosis not present

## 2020-04-07 DIAGNOSIS — L308 Other specified dermatitis: Secondary | ICD-10-CM | POA: Diagnosis not present

## 2020-04-07 DIAGNOSIS — L02212 Cutaneous abscess of back [any part, except buttock]: Secondary | ICD-10-CM | POA: Diagnosis not present

## 2020-04-07 DIAGNOSIS — D485 Neoplasm of uncertain behavior of skin: Secondary | ICD-10-CM | POA: Diagnosis not present

## 2020-04-17 DIAGNOSIS — L0390 Cellulitis, unspecified: Secondary | ICD-10-CM | POA: Diagnosis not present

## 2020-04-29 DIAGNOSIS — N132 Hydronephrosis with renal and ureteral calculous obstruction: Secondary | ICD-10-CM | POA: Diagnosis not present

## 2020-04-29 DIAGNOSIS — N200 Calculus of kidney: Secondary | ICD-10-CM | POA: Diagnosis not present

## 2020-04-29 DIAGNOSIS — K8020 Calculus of gallbladder without cholecystitis without obstruction: Secondary | ICD-10-CM | POA: Diagnosis not present

## 2020-04-29 DIAGNOSIS — K5730 Diverticulosis of large intestine without perforation or abscess without bleeding: Secondary | ICD-10-CM | POA: Diagnosis not present

## 2020-04-29 DIAGNOSIS — N130 Hydronephrosis with ureteropelvic junction obstruction: Secondary | ICD-10-CM | POA: Diagnosis not present

## 2020-05-01 DIAGNOSIS — L02212 Cutaneous abscess of back [any part, except buttock]: Secondary | ICD-10-CM | POA: Diagnosis not present

## 2020-05-01 DIAGNOSIS — L0390 Cellulitis, unspecified: Secondary | ICD-10-CM | POA: Diagnosis not present

## 2020-05-19 DIAGNOSIS — N200 Calculus of kidney: Secondary | ICD-10-CM | POA: Diagnosis not present

## 2020-05-19 DIAGNOSIS — N201 Calculus of ureter: Secondary | ICD-10-CM | POA: Diagnosis not present

## 2020-05-23 ENCOUNTER — Other Ambulatory Visit: Payer: Self-pay | Admitting: Urology

## 2020-05-29 DIAGNOSIS — L0390 Cellulitis, unspecified: Secondary | ICD-10-CM | POA: Diagnosis not present

## 2020-06-02 DIAGNOSIS — Z23 Encounter for immunization: Secondary | ICD-10-CM | POA: Diagnosis not present

## 2020-06-17 ENCOUNTER — Other Ambulatory Visit: Payer: Self-pay

## 2020-06-17 ENCOUNTER — Encounter (HOSPITAL_BASED_OUTPATIENT_CLINIC_OR_DEPARTMENT_OTHER): Payer: Self-pay | Admitting: Urology

## 2020-06-17 NOTE — Progress Notes (Signed)
Spoke w/ via phone for pre-op interview--- Pt Lab needs dos----  Istat and EKG             Lab results------ no COVID test ------ 06-20-2020 @ 1330 Arrive at -------  0530 on 06-24-2020 NPO after MN NO Solid Food.  Clear liquids from MN until---  0430 Med rec completed Medications to take morning of surgery ----- Norvasc Diabetic medication ----- do not take metformin morning  Of surgery Patient instructed to bring photo id and insurance card day of surgery Patient aware to have Driver (ride ) / caregiver    for 24 hours after surgery -- wife, Adam Mata Patient Special Instructions -----  n/a Pre-Op special Istructions ----- n/a Patient verbalized understanding of instructions that were given at this phone interview. Patient denies shortness of breath, chest pain, fever, cough at this phone interview.

## 2020-06-18 DIAGNOSIS — L309 Dermatitis, unspecified: Secondary | ICD-10-CM | POA: Diagnosis not present

## 2020-06-18 DIAGNOSIS — L299 Pruritus, unspecified: Secondary | ICD-10-CM | POA: Diagnosis not present

## 2020-06-19 DIAGNOSIS — H2512 Age-related nuclear cataract, left eye: Secondary | ICD-10-CM | POA: Diagnosis not present

## 2020-06-19 DIAGNOSIS — H35371 Puckering of macula, right eye: Secondary | ICD-10-CM | POA: Diagnosis not present

## 2020-06-20 ENCOUNTER — Other Ambulatory Visit (HOSPITAL_COMMUNITY)
Admission: RE | Admit: 2020-06-20 | Discharge: 2020-06-20 | Disposition: A | Discharge status: Home / Self Care | Payer: Medicare Other | Source: Ambulatory Visit | Attending: Urology | Admitting: Urology

## 2020-06-20 DIAGNOSIS — Z01812 Encounter for preprocedural laboratory examination: Secondary | ICD-10-CM | POA: Diagnosis not present

## 2020-06-20 DIAGNOSIS — Z20822 Contact with and (suspected) exposure to covid-19: Secondary | ICD-10-CM | POA: Insufficient documentation

## 2020-06-20 LAB — SARS CORONAVIRUS 2 (TAT 6-24 HRS): SARS Coronavirus 2: NEGATIVE

## 2020-06-24 ENCOUNTER — Encounter (HOSPITAL_BASED_OUTPATIENT_CLINIC_OR_DEPARTMENT_OTHER)
Admission: RE | Disposition: A | Discharge status: Home / Self Care | Payer: Self-pay | Source: Home / Self Care | Attending: Urology

## 2020-06-24 ENCOUNTER — Ambulatory Visit (HOSPITAL_COMMUNITY): Payer: Medicare Other

## 2020-06-24 ENCOUNTER — Ambulatory Visit (HOSPITAL_BASED_OUTPATIENT_CLINIC_OR_DEPARTMENT_OTHER): Payer: Medicare Other | Admitting: Anesthesiology

## 2020-06-24 ENCOUNTER — Encounter (HOSPITAL_BASED_OUTPATIENT_CLINIC_OR_DEPARTMENT_OTHER): Payer: Self-pay | Admitting: Urology

## 2020-06-24 ENCOUNTER — Ambulatory Visit (HOSPITAL_BASED_OUTPATIENT_CLINIC_OR_DEPARTMENT_OTHER)
Admission: RE | Admit: 2020-06-24 | Discharge: 2020-06-24 | Disposition: A | Discharge status: Home / Self Care | Payer: Medicare Other | Attending: Urology | Admitting: Urology

## 2020-06-24 ENCOUNTER — Other Ambulatory Visit: Payer: Self-pay

## 2020-06-24 DIAGNOSIS — Z7984 Long term (current) use of oral hypoglycemic drugs: Secondary | ICD-10-CM | POA: Diagnosis not present

## 2020-06-24 DIAGNOSIS — N201 Calculus of ureter: Secondary | ICD-10-CM

## 2020-06-24 DIAGNOSIS — N183 Chronic kidney disease, stage 3 unspecified: Secondary | ICD-10-CM | POA: Diagnosis not present

## 2020-06-24 DIAGNOSIS — N132 Hydronephrosis with renal and ureteral calculous obstruction: Secondary | ICD-10-CM | POA: Insufficient documentation

## 2020-06-24 DIAGNOSIS — Z79899 Other long term (current) drug therapy: Secondary | ICD-10-CM | POA: Diagnosis not present

## 2020-06-24 DIAGNOSIS — Z87442 Personal history of urinary calculi: Secondary | ICD-10-CM | POA: Diagnosis not present

## 2020-06-24 DIAGNOSIS — N200 Calculus of kidney: Secondary | ICD-10-CM | POA: Diagnosis not present

## 2020-06-24 DIAGNOSIS — I129 Hypertensive chronic kidney disease with stage 1 through stage 4 chronic kidney disease, or unspecified chronic kidney disease: Secondary | ICD-10-CM | POA: Diagnosis not present

## 2020-06-24 DIAGNOSIS — E1122 Type 2 diabetes mellitus with diabetic chronic kidney disease: Secondary | ICD-10-CM | POA: Diagnosis not present

## 2020-06-24 HISTORY — DX: Chronic kidney disease, stage 3 unspecified: N18.30

## 2020-06-24 HISTORY — DX: Essential (primary) hypertension: I10

## 2020-06-24 HISTORY — DX: Calculus of ureter: N20.1

## 2020-06-24 HISTORY — DX: Type 2 diabetes mellitus without complications: E11.9

## 2020-06-24 HISTORY — DX: Personal history of urinary calculi: Z87.442

## 2020-06-24 HISTORY — PX: CYSTOSCOPY/RETROGRADE/URETEROSCOPY/STONE EXTRACTION WITH BASKET: SHX5317

## 2020-06-24 HISTORY — DX: Personal history of other diseases of the musculoskeletal system and connective tissue: Z87.39

## 2020-06-24 HISTORY — DX: Rash and other nonspecific skin eruption: R21

## 2020-06-24 HISTORY — DX: Presence of spectacles and contact lenses: Z97.3

## 2020-06-24 LAB — POCT I-STAT, CHEM 8
BUN: 17 mg/dL (ref 8–23)
Calcium, Ion: 1.29 mmol/L (ref 1.15–1.40)
Chloride: 104 mmol/L (ref 98–111)
Creatinine, Ser: 1.2 mg/dL (ref 0.61–1.24)
Glucose, Bld: 119 mg/dL — ABNORMAL HIGH (ref 70–99)
HCT: 37 % — ABNORMAL LOW (ref 39.0–52.0)
Hemoglobin: 12.6 g/dL — ABNORMAL LOW (ref 13.0–17.0)
Potassium: 4.1 mmol/L (ref 3.5–5.1)
Sodium: 141 mmol/L (ref 135–145)
TCO2: 24 mmol/L (ref 22–32)

## 2020-06-24 LAB — GLUCOSE, CAPILLARY: Glucose-Capillary: 121 mg/dL — ABNORMAL HIGH (ref 70–99)

## 2020-06-24 SURGERY — CYSTOSCOPY, WITH CALCULUS REMOVAL USING BASKET
Anesthesia: General | Site: Ureter | Laterality: Right

## 2020-06-24 MED ORDER — PHENYLEPHRINE HCL (PRESSORS) 10 MG/ML IV SOLN
INTRAVENOUS | Status: DC | PRN
  Administered 2020-06-24 (×3): 80 ug via INTRAVENOUS

## 2020-06-24 MED ORDER — ONDANSETRON HCL 4 MG/2ML IJ SOLN
INTRAMUSCULAR | Status: DC | PRN
  Administered 2020-06-24: 4 mg via INTRAVENOUS

## 2020-06-24 MED ORDER — FENTANYL CITRATE (PF) 100 MCG/2ML IJ SOLN
INTRAMUSCULAR | Status: DC | PRN
  Administered 2020-06-24: 25 ug via INTRAVENOUS
  Administered 2020-06-24: 50 ug via INTRAVENOUS
  Administered 2020-06-24: 25 ug via INTRAVENOUS

## 2020-06-24 MED ORDER — SODIUM CHLORIDE 0.9 % IV SOLN: INTRAVENOUS | Status: DC

## 2020-06-24 MED ORDER — LIDOCAINE HCL (CARDIAC) PF 100 MG/5ML IV SOSY
PREFILLED_SYRINGE | INTRAVENOUS | Status: DC | PRN
  Administered 2020-06-24: 80 mg via INTRAVENOUS

## 2020-06-24 MED ORDER — SODIUM CHLORIDE 0.9 % IR SOLN
Status: DC | PRN
  Administered 2020-06-24: 3000 mL via INTRAVESICAL

## 2020-06-24 MED ORDER — CEFAZOLIN SODIUM-DEXTROSE 2-4 GM/100ML-% IV SOLN
INTRAVENOUS | Status: AC
  Filled 2020-06-24: qty 100

## 2020-06-24 MED ORDER — ONDANSETRON HCL 4 MG/2ML IJ SOLN: 4.0000 mg | Freq: Once | INTRAMUSCULAR | Status: DC | PRN

## 2020-06-24 MED ORDER — IOHEXOL 300 MG/ML  SOLN
INTRAMUSCULAR | Status: DC | PRN
  Administered 2020-06-24: 16 mL via URETHRAL

## 2020-06-24 MED ORDER — OXYCODONE HCL 5 MG/5ML PO SOLN: 5.0000 mg | Freq: Once | ORAL | Status: AC | PRN

## 2020-06-24 MED ORDER — FENTANYL CITRATE (PF) 100 MCG/2ML IJ SOLN
INTRAMUSCULAR | Status: AC
  Filled 2020-06-24: qty 2

## 2020-06-24 MED ORDER — GLYCOPYRROLATE 0.2 MG/ML IJ SOLN
INTRAMUSCULAR | Status: DC | PRN
  Administered 2020-06-24: .1 mg via INTRAVENOUS

## 2020-06-24 MED ORDER — PROPOFOL 10 MG/ML IV BOLUS
INTRAVENOUS | Status: DC | PRN
  Administered 2020-06-24: 30 mg via INTRAVENOUS
  Administered 2020-06-24: 40 mg via INTRAVENOUS
  Administered 2020-06-24: 150 mg via INTRAVENOUS

## 2020-06-24 MED ORDER — ACETAMINOPHEN 500 MG PO TABS
ORAL_TABLET | ORAL | Status: AC
  Filled 2020-06-24: qty 2

## 2020-06-24 MED ORDER — OXYCODONE HCL 5 MG PO TABS
ORAL_TABLET | ORAL | Status: AC
  Filled 2020-06-24: qty 1

## 2020-06-24 MED ORDER — OXYCODONE HCL 5 MG PO TABS
5.0000 mg | ORAL_TABLET | Freq: Once | ORAL | Status: AC | PRN
  Administered 2020-06-24: 5 mg via ORAL

## 2020-06-24 MED ORDER — LACTATED RINGERS IV SOLN: INTRAVENOUS | Status: DC | PRN

## 2020-06-24 MED ORDER — CEFAZOLIN SODIUM-DEXTROSE 2-4 GM/100ML-% IV SOLN
2.0000 g | Freq: Once | INTRAVENOUS | Status: AC
  Administered 2020-06-24: 2 g via INTRAVENOUS

## 2020-06-24 MED ORDER — EPHEDRINE SULFATE 50 MG/ML IJ SOLN
INTRAMUSCULAR | Status: DC | PRN
  Administered 2020-06-24 (×4): 5 mg via INTRAVENOUS

## 2020-06-24 MED ORDER — ACETAMINOPHEN 500 MG PO TABS
1000.0000 mg | ORAL_TABLET | Freq: Once | ORAL | Status: AC
  Administered 2020-06-24: 1000 mg via ORAL

## 2020-06-24 MED ORDER — FENTANYL CITRATE (PF) 100 MCG/2ML IJ SOLN: 25.0000 ug | INTRAMUSCULAR | Status: DC | PRN

## 2020-06-24 MED ORDER — PROPOFOL 10 MG/ML IV BOLUS
INTRAVENOUS | Status: AC
  Filled 2020-06-24: qty 40

## 2020-06-24 MED ORDER — 0.9 % SODIUM CHLORIDE (POUR BTL) OPTIME
TOPICAL | Status: DC | PRN
  Administered 2020-06-24: 500 mL

## 2020-06-24 SURGICAL SUPPLY — 25 items
BAG DRAIN URO-CYSTO SKYTR STRL (DRAIN) ×2 IMPLANT
BAG DRN UROCATH (DRAIN) ×1
CATH URET 5FR 28IN CONE TIP (BALLOONS)
CATH URET 5FR 28IN OPEN ENDED (CATHETERS) IMPLANT
CATH URET 5FR 70CM CONE TIP (BALLOONS) IMPLANT
CATH URET DUAL LUMEN 6-10FR 50 (CATHETERS) IMPLANT
CLOTH BEACON ORANGE TIMEOUT ST (SAFETY) ×2 IMPLANT
EXTRACTOR STONE 1.7FRX115CM (UROLOGICAL SUPPLIES) ×2 IMPLANT
GLOVE SURG ENC MOIS LTX SZ7.5 (GLOVE) ×2 IMPLANT
GLOVE SURG ENC MOIS LTX SZ8 (GLOVE) IMPLANT
GOWN STRL REUS W/TWL LRG LVL3 (GOWN DISPOSABLE) ×2 IMPLANT
GUIDEWIRE ANG ZIPWIRE 038X150 (WIRE) IMPLANT
GUIDEWIRE STR DUAL SENSOR (WIRE) ×2 IMPLANT
GUIDEWIRE ZIPWRE .038 STRAIGHT (WIRE) ×2 IMPLANT
IV NS IRRIG 3000ML ARTHROMATIC (IV SOLUTION) ×4 IMPLANT
KIT TURNOVER CYSTO (KITS) ×2 IMPLANT
MANIFOLD NEPTUNE II (INSTRUMENTS) ×2 IMPLANT
NS IRRIG 500ML POUR BTL (IV SOLUTION) ×2 IMPLANT
PACK CYSTO (CUSTOM PROCEDURE TRAY) ×2 IMPLANT
SHEATH URETERAL 12FRX35CM (MISCELLANEOUS) ×2 IMPLANT
STENT URET 6FRX26 CONTOUR (STENTS) ×2 IMPLANT
TRACTIP FLEXIVA PULS ID 200XHI (Laser) ×1 IMPLANT
TRACTIP FLEXIVA PULSE ID 200 (Laser) ×2
TUBE CONNECTING 12X1/4 (SUCTIONS) ×2 IMPLANT
TUBING UROLOGY SET (TUBING) ×2 IMPLANT

## 2020-06-24 NOTE — Transfer of Care (Signed)
Immediate Anesthesia Transfer of Care Note  Patient: Adam Mata  Procedure(s) Performed: CYSTOSCOPY/bILATERAL Chaya Jan /URETEROSCOPY/HOLMIUM LASER RIGHT  LITHOTRIPSY/RIGHT STONE EXTRACTION WITH BASKET/ STENT PLACEMENT (Right Ureter)  Patient Location: PACU  Anesthesia Type:General  Level of Consciousness: awake, alert , oriented and patient cooperative  Airway & Oxygen Therapy: Patient Spontanous Breathing and Patient connected to nasal cannula oxygen  Post-op Assessment: Report given to RN and Post -op Vital signs reviewed and stable  Post vital signs: Reviewed and stable  Last Vitals:  Vitals Value Taken Time  BP    Temp    Pulse 107 06/24/20 0851  Resp 16 06/24/20 0851  SpO2 100 % 06/24/20 0851  Vitals shown include unvalidated device data.  Last Pain:  Vitals:   06/24/20 0630  TempSrc: Oral  PainSc: 0-No pain      Patients Stated Pain Goal: 5 ([id number])  Complications: No complications documented.

## 2020-06-24 NOTE — Anesthesia Postprocedure Evaluation (Signed)
Anesthesia Post Note  Patient: Adam Mata  Procedure(s) Performed: CYSTOSCOPY/bILATERAL Chaya Jan /URETEROSCOPY/HOLMIUM LASER RIGHT  LITHOTRIPSY/RIGHT STONE EXTRACTION WITH BASKET/ STENT PLACEMENT (Right Ureter)     Patient location during evaluation: PACU Anesthesia Type: General Level of consciousness: awake and alert, oriented and patient cooperative Pain management: pain level controlled Vital Signs Assessment: post-procedure vital signs reviewed and stable Respiratory status: spontaneous breathing, nonlabored ventilation and respiratory function stable Cardiovascular status: blood pressure returned to baseline and stable Postop Assessment: no apparent nausea or vomiting Anesthetic complications: no   No complications documented.  Last Vitals:  Vitals:   06/24/20 0915 06/24/20 0930  BP: 124/84 126/70  Pulse: 86 82  Resp: 15 13  Temp:    SpO2: 94% 95%    Last Pain:  Vitals:   06/24/20 0930  TempSrc:   PainSc: 0-No pain                 Pervis Hocking

## 2020-06-24 NOTE — H&P (Addendum)
H&P  Chief Complaint: Right renal and ureteral stones, left renal stones   History of Present Illness: Adam Mata is a 80 year old male with a history of kidney stones.  [place]  We remove the stent in the office.  He has some known left stones.  On routine surveillance ultrasound following that procedure in February 2022 of this year there was some left hydronephrosis.  KUB was equivocal.  CT scan revealed 2-3 right distal stones with proximal hydroureteronephrosis and 2 to 3 stones in the right kidney largest about 6 mm.  He had no pain.  On follow-up ultrasound he had some continued hydro and the stones were visible on the KUB but given the multiple stones he elected to proceed with ureteroscopy on the right.  Today he is well.  He has had no flank pain.  No fever.  No dysuria or gross hematuria.  He has not seen a stone pass.  Past Medical History:  Diagnosis Date  . Arthritis   . CKD (chronic kidney disease), stage III [facility])    nephrologist--- dr Joelyn Oms [facility])  . History of gout    per pt last episode many yrs ago  . History of kidney stones   . Hypertension    followed by pcp  . Rash    per pt buttock area  . Right ureteral calculus   . Type 2 diabetes mellitus ([place])    followed by pcp   (06-17-2020 pt stated does not check blood sugar at home)  . Wears glasses    Past Surgical History:  Procedure Laterality Date  . CARPAL TUNNEL RELEASE Right 07/25/2012   Procedure: CARPAL TUNNEL RELEASE;  Surgeon: Cammie Sickle., MD;  Location: [place];  Service: Orthopedics;  Laterality: Right;  . CARPAL TUNNEL RELEASE Left 09/19/2012   Procedure: LEFT CARPAL TUNNEL RELEASE;  Surgeon: Cammie Sickle., MD;  Location: [place];  Service: Orthopedics;  Laterality: Left;  . CATARACT EXTRACTION W/ INTRAOCULAR LENS IMPLANT Right 2017  . COLONOSCOPY    .  CYSTOSCOPY/URETEROSCOPY/HOLMIUM LASER/STENT PLACEMENT  x3  last one 01-22-2020  . TONSILLECTOMY  child  . TRIGGER FINGER RELEASE Left 09/19/2012   Procedure: RELEASE TRIGGER FINGER/A-1 PULLEY LEFT LONG FINGER;  Surgeon: Cammie Sickle., MD;  Location: [place];  Service: Orthopedics;  Laterality: Left;    Home Medications:  Medications Prior to Admission  Medication Sig Dispense Refill Last Dose  . amLODipine (NORVASC) 5 MG tablet Take 5 mg by mouth daily.   06/24/2020 at 0400  . metFORMIN (GLUCOPHAGE-XR) 750 MG 24 hr tablet Take 750 mg by mouth 2 (two) times daily.   06/23/2020 at Unknown time  . potassium citrate (UROCIT-K) 5 MEQ (540 MG) SR tablet Take 15 mEq by mouth 2 (two) times daily.   06/23/2020 at Unknown time  . tamsulosin (FLOMAX) 0.4 MG CAPS capsule Take 0.4 mg by mouth every evening.   06/23/2020 at Unknown time  . acetaminophen (TYLENOL) 500 MG tablet Take 500 mg by mouth every 6 (six) hours as needed.   More than a month at Unknown time   Allergies: No Known Allergies  History reviewed. No pertinent family history. Social History:  reports that he has never smoked. He has never used smokeless tobacco. He reports that he does not drink alcohol and does not use drugs.  ROS: A complete review of systems was performed.  All systems are negative except for pertinent findings as noted. Review of Systems  All other systems reviewed and are negative.    Physical Exam:  Vital signs in last 24 hours: Temp:  [97.1 F (36.2 C)] 97.1 F (36.2 C) (04/26 0630) Pulse Rate:  [74] 74 (04/26 0630) Resp:  [18] 18 (04/26 0630) BP: (140)/(69) 140/69 (04/26 0630) SpO2:  [99 %] 99 % (04/26 0630) Weight:  [96.2 kg] 96.2 kg (04/26 0630) General:  Alert and oriented, No acute distress HEENT: Normocephalic, atraumatic Cardiovascular: Regular rate and rhythm Lungs: Regular rate and effort Abdomen: Soft, nontender, nondistended, no abdominal masses Back: No CVA  tenderness Extremities: No edema Neurologic: Grossly intact  Laboratory Data:  Results for orders placed or performed during the hospital encounter of 06/24/20 (from the past 24 hour(s))  I-STAT, chem 8     Status: Abnormal   Collection Time: 06/24/20  6:36 AM  Result Value Ref Range   Sodium 141 135 - 145 mmol/L   Potassium 4.1 3.5 - 5.1 mmol/L   Chloride 104 98 - 111 mmol/L   BUN 17 8 - 23 mg/dL   Creatinine, Ser 1.20 0.61 - 1.24 mg/dL   Glucose, Bld 119 (H) 70 - 99 mg/dL   Calcium, Ion 1.29 1.15 - 1.40 mmol/L   TCO2 24 22 - 32 mmol/L   Hemoglobin 12.6 (L) 13.0 - 17.0 g/dL   HCT 37.0 (L) 39.0 - 52.0 %   Recent Results (from the past 240 hour(s))  SARS CORONAVIRUS 2 (TAT 6-24 HRS) Nasopharyngeal Nasopharyngeal Swab     Status: None   Collection Time: 06/20/20  1:34 PM   Specimen: Nasopharyngeal Swab  Result Value Ref Range Status   SARS Coronavirus 2 NEGATIVE NEGATIVE Final    Comment: (NOTE) SARS-CoV-2 target nucleic acids are NOT DETECTED.  The SARS-CoV-2 RNA is generally detectable in upper and lower respiratory specimens during the acute phase of infection. Negative results do not preclude SARS-CoV-2 infection, do not rule out co-infections with other pathogens, and should not be used as the sole basis for treatment or other patient management decisions. Negative results must be combined with clinical observations, patient history, and epidemiological information. The expected result is Negative.  Fact Sheet for Patients: SugarRoll.be  Fact Sheet for Healthcare Providers: https://www.woods-mathews.com/  This test is not yet approved or cleared by the [place] FDA and  has been authorized for detection and/or diagnosis of SARS-CoV-2 by FDA under an Emergency Use Authorization (EUA). This EUA will remain  in effect (meaning this test can be used) for the duration of the COVID-19 declaration under Se ction 564(b)(1) of the  Act, 21 U.S.C. section 360bbb-3(b)(1), unless the authorization is terminated or revoked sooner.  Performed at [facility], [place] [address] [place], [place] [address]    Creatinine: Recent Labs    06/24/20 0636  CREATININE 1.20    Impression/Assessment/plan: I discussed with the patient the nature, potential benefits, risks and alternatives to cystoscopy with right retrograde pyelogram, right ureteroscopy laser lithotripsy and stent placement, including side effects of the proposed treatment, the likelihood of the patient achieving the goals of the procedure, and any potential problems that might occur during the procedure or recuperation.  We also discussed he may need a staged procedure and the rationale for a staged procedure. We also discussed he may have passed these stones. We discussed if we are able to clear the right side today should we approach the left.  We will not  proceed with left today unless he needs a staged procedure in which case we could go ahead and stent both sides.  All questions answered.  Patient elects to proceed.    Festus Aloe 06/24/2020, 7:22 AM

## 2020-06-24 NOTE — Anesthesia Procedure Notes (Signed)
Procedure Name: LMA Insertion Date/Time: 06/24/2020 7:40 AM Performed by: Georgeanne Nim, CRNA Pre-anesthesia Checklist: Patient identified, Emergency Drugs available, Suction available, Patient being monitored and Timeout performed Patient Re-evaluated:Patient Re-evaluated prior to induction Oxygen Delivery Method: Circle system utilized Preoxygenation: Pre-oxygenation with 100% oxygen Induction Type: IV induction LMA: LMA inserted LMA Size: 4.0 Number of attempts: 1 Placement Confirmation: positive ETCO2,  CO2 detector and breath sounds checked- equal and bilateral Tube secured with: Tape Dental Injury: Teeth and Oropharynx as per pre-operative assessment

## 2020-06-24 NOTE — Anesthesia Preprocedure Evaluation (Addendum)
Anesthesia Evaluation  Patient identified by MRN, date of birth, ID band Patient awake    Reviewed: Allergy & Precautions, NPO status , Patient's Chart, lab work & pertinent test results  Airway Mallampati: III  TM Distance: >3 FB Neck ROM: Full    Dental no notable dental hx. (+) Teeth Intact, Dental Advisory Given   Pulmonary neg pulmonary ROS,  Had sleep study about 10 years ago, negative Denies snoring at night   Pulmonary exam normal breath sounds clear to auscultation       Cardiovascular hypertension, Pt. on medications Normal cardiovascular exam Rhythm:Regular Rate:Normal     Neuro/Psych negative neurological ROS  negative psych ROS   GI/Hepatic negative GI ROS, Neg liver ROS,   Endo/Other  diabetes, Well Controlled, Type 2, Oral Hypoglycemic AgentsObesity BMI 32  Renal/GU Renal disease (right ureteral calculi)CKD 3, Cr 1.2  negative genitourinary   Musculoskeletal  (+) Arthritis , Osteoarthritis,    Abdominal   Peds negative pediatric ROS (+)  Hematology negative hematology ROS (+)   Anesthesia Other Findings   Reproductive/Obstetrics negative OB ROS                            Anesthesia Physical Anesthesia Plan  ASA: II  Anesthesia Plan: General   Post-op Pain Management:    Induction: Intravenous  PONV Risk Score and Plan: Ondansetron, Dexamethasone, Midazolam and Treatment may vary due to age or medical condition  Airway Management Planned: LMA  Additional Equipment: None  Intra-op Plan:   Post-operative Plan: Extubation in OR  Informed Consent: I have reviewed the patients History and Physical, chart, labs and discussed the procedure including the risks, benefits and alternatives for the proposed anesthesia with the patient or authorized representative who has indicated his/her understanding and acceptance.     Dental advisory given  Plan Discussed with:  CRNA  Anesthesia Plan Comments:         Anesthesia Quick Evaluation

## 2020-06-24 NOTE — Discharge Instructions (Signed)
Ureteral Stent Implantation, Care After This sheet gives you information about how to care for yourself after your procedure. Your health care provider may also give you more specific instructions. If you have problems or questions, contact your health care provider.  Removal of the stent-remove the stent on Friday morning, 06/27/2020, as instructed slowly pulling the string.  What can I expect after the procedure? After the procedure, it is common to have:  Nausea.  Mild pain when you urinate. You may feel this pain in your lower back or lower abdomen. The pain should stop within a few minutes after you urinate. This may last for up to 1 week.  A small amount of blood in your urine for several days. Follow these instructions at home: Medicines  Take over-the-counter and prescription medicines only as told by your health care provider.  If you were prescribed an antibiotic medicine, take it as told by your health care provider. Do not stop taking the antibiotic even if you start to feel better.  Do not drive for 24 hours if you were given a sedative during your procedure.  Ask your health care provider if the medicine prescribed to you requires you to avoid driving or using heavy machinery. Activity  Rest as told by your health care provider.  Avoid sitting for a long time without moving. Get up to take short walks every 1-2 hours. This is important to improve blood flow and breathing. Ask for help if you feel weak or unsteady.  Return to your normal activities as told by your health care provider. Ask your health care provider what activities are safe for you. General instructions  Watch for any blood in your urine. Call your health care provider if the amount of blood in your urine increases.  If you have a catheter: ? Follow instructions from your health care provider about taking care of your catheter and collection bag. ? Do not take baths, swim, or use a hot tub until your  health care provider approves. Ask your health care provider if you may take showers. You may only be allowed to take sponge baths.  Drink enough fluid to keep your urine pale yellow.  Do not use any products that contain nicotine or tobacco, such as cigarettes, e-cigarettes, and chewing tobacco. These can delay healing after surgery. If you need help quitting, ask your health care provider.  Keep all follow-up visits as told by your health care provider. This is important.   Contact a health care provider if:  You have pain that gets worse or does not get better with medicine, especially pain when you urinate.  You have difficulty urinating.  You feel nauseous or you vomit repeatedly during a period of more than 2 days after the procedure. Get help right away if:  Your urine is dark red or has blood clots in it.  You are leaking urine (have incontinence).  The end of the stent comes out of your urethra.  You cannot urinate.  You have sudden, sharp, or severe pain in your abdomen or lower back.  You have a fever.  You have swelling or pain in your legs.  You have difficulty breathing. Summary  After the procedure, it is common to have mild pain when you urinate that goes away within a few minutes after you urinate. This may last for up to 1 week.  Watch for any blood in your urine. Call your health care provider if the amount of blood in  your urine increases.  Take over-the-counter and prescription medicines only as told by your health care provider.  Drink enough fluid to keep your urine pale yellow. This information is not intended to replace advice given to you by your health care provider. Make sure you discuss any questions you have with your health care provider. Document Revised: 11/22/2017 Document Reviewed: 11/23/2017 Elsevier Patient Education  2021 [employer] Instructions  Activity: Get plenty of rest for the remainder of the  day. A responsible adult should stay with you for 24 hours following the procedure.  For the next 24 hours, DO NOT: -Drive a car -Paediatric nurse -Drink alcoholic beverages -Take any medication unless instructed by your physician -Make any legal decisions or sign important papers.  Meals: Start with liquid foods such as gelatin or soup. Progress to regular foods as tolerated. Avoid greasy, spicy, heavy foods. If nausea and/or vomiting occur, drink only clear liquids until the nausea and/or vomiting subsides. Call your physician if vomiting continues.  Special Instructions/Symptoms: Your throat may feel dry or sore from the anesthesia or the breathing tube placed in your throat during surgery. If this causes discomfort, gargle with warm salt water. The discomfort should disappear within 24 hours.  If you had a scopolamine patch placed behind your ear for the management of post- operative nausea and/or vomiting:  1. The medication in the patch is effective for 72 hours, after which it should be removed.  Wrap patch in a tissue and discard in the trash. Wash hands thoroughly with soap and water. 2. You may remove the patch earlier than 72 hours if you experience unpleasant side effects which may include dry mouth, dizziness or visual disturbances. 3. Avoid touching the patch. Wash your hands with soap and water after contact with the patch.

## 2020-06-24 NOTE — Op Note (Signed)
Preoperative diagnosis: Right renal stone, right ureteral stone, left renal stone Postoperative diagnosis: Same  Procedure: Bilateral retrograde pyelogram, right ureteroscopy laser lithotripsy, stone basket extraction, right stent placement  Surgeon: Junious Silk  Anesthesia: General  Indication for procedure: [name] is a 80 year old male with a history of stones office ultrasound revealed right hydro and CT revealed 2 right distal stones and 2-3 right renal stones.  He was brought today for definitive stone management.  Findings: On cystoscopy the urethra and prostate were unremarkable.  Bladder without stone or foreign body.  No mucosal lesion.  The ureteral orifices were laterally displaced and I happened to catch him at the beginning with a full bladder and noted the ureteral orifice ease were widely patent and likely refluxing or reflux in the past.  With the bladder empty they hide behind a muscle fold and I could see where they would be more difficult to find.  The right ureter was widely patent and dilated and the intramural tunnel was wide open, the ureterovesical junction had its typical narrowing and this is where the right distal stones were held up.  Right retrograde pyelogram-this outlined a single ureter single collecting system unit with 2 filling defects in the right distal ureter consistent with the stones and right proximal hydroureteronephrosis.  I performed a left retrograde simply to better delineate his anatomy and ensure no left hydro in the event he has another stone episode.  Left retrograde pyelogram-this outlined a single ureter single collecting system unit without filling defect, stricture or dilation.  On right ureteroscopy 2 stones were located in the right distal ureter and 3 stones in the right kidney.  Because of the ureteral dilation I was able to remove all 3 right renal stones intact.  Description of procedure: After consent was obtained patient brought to  the operating room.  After adequate anesthesia was placed lithotomy position and prepped and draped in the usual sterile fashion.  A timeout was performed for the patient and procedure.  Cystoscope was passed per urethra the bladder carefully inspected.  The right ureteral orifice was cannulated with a 6 [place] open-ended catheter and retrograde injection of contrast was performed.  Sensor wire advanced and coiled in the collecting system.  Dual-lumen semirigid ureteroscope was advanced in the right distal ureter where the stones were located.  They were broken into several fragments at a setting of 0.8 and 8 with a 200 m laser fiber.  Fragments were sequentially dropped with an engage basket into the bladder.  Ureter was clear up into the proximal ureter where a Glidewire was advanced under direct vision.  The scope was backed out.  Short access sheath was passed without difficulty.  Dual-lumen ureteroscope advanced to the kidney where 3 stones were grasped with the engage and able to be removed intact without difficulty.  A couple of the stones were large enough where we had to loosen the basket and rotate the stone as it entered the sheath, but again overall stones came out very easily.  Further inspection of the collecting system noted there to be no other stones.  The access sheath was backed out on the ureteroscope and the collecting system, renal pelvis and ureter carefully inspected and noted to have no other stones or injury.   Cystoscope passed back into the bladder where the bladder was inspected and the stone fragments drained.  6 [place] open-ended catheter was used to cannulate the left ureteral orifice and retrograde injection of contrast was performed.  There  was brisk drainage on the left.   Scope was removed and then the wire backloaded and a right 6 x 26 cm ureteral stent advanced.  Wire was removed with a good coil seen in the upper pole and a good coil in the bladder.  Bladder was drained  and the scope removed.  He was awakened taken the cover room in stable condition.  Complications: None  Blood loss: Minimal  Specimens to patient: Stone fragments  Drains: 6 x 26 cm right ureteral stent with string   Disposition: Patient stable to PACU-I discussed the procedure, postop course and follow-up with his wife Adam Mata.

## 2020-06-26 ENCOUNTER — Encounter (HOSPITAL_BASED_OUTPATIENT_CLINIC_OR_DEPARTMENT_OTHER): Payer: Self-pay | Admitting: Urology

## 2020-06-27 DIAGNOSIS — L299 Pruritus, unspecified: Secondary | ICD-10-CM | POA: Diagnosis not present

## 2020-06-27 DIAGNOSIS — L300 Nummular dermatitis: Secondary | ICD-10-CM | POA: Diagnosis not present

## 2020-07-01 DIAGNOSIS — N130 Hydronephrosis with ureteropelvic junction obstruction: Secondary | ICD-10-CM | POA: Diagnosis not present

## 2020-07-01 DIAGNOSIS — N202 Calculus of kidney with calculus of ureter: Secondary | ICD-10-CM | POA: Diagnosis not present

## 2020-07-15 DIAGNOSIS — L300 Nummular dermatitis: Secondary | ICD-10-CM | POA: Diagnosis not present

## 2020-08-19 DIAGNOSIS — L299 Pruritus, unspecified: Secondary | ICD-10-CM | POA: Diagnosis not present

## 2020-08-19 DIAGNOSIS — L300 Nummular dermatitis: Secondary | ICD-10-CM | POA: Diagnosis not present

## 2020-08-19 DIAGNOSIS — L309 Dermatitis, unspecified: Secondary | ICD-10-CM | POA: Diagnosis not present

## 2020-09-10 DIAGNOSIS — N401 Enlarged prostate with lower urinary tract symptoms: Secondary | ICD-10-CM | POA: Diagnosis not present

## 2020-09-10 DIAGNOSIS — R3912 Poor urinary stream: Secondary | ICD-10-CM | POA: Diagnosis not present

## 2020-09-10 DIAGNOSIS — N200 Calculus of kidney: Secondary | ICD-10-CM | POA: Diagnosis not present

## 2020-12-16 DIAGNOSIS — L0211 Cutaneous abscess of neck: Secondary | ICD-10-CM | POA: Diagnosis not present

## 2020-12-16 DIAGNOSIS — L820 Inflamed seborrheic keratosis: Secondary | ICD-10-CM | POA: Diagnosis not present

## 2020-12-16 DIAGNOSIS — L209 Atopic dermatitis, unspecified: Secondary | ICD-10-CM | POA: Diagnosis not present

## 2020-12-16 DIAGNOSIS — L299 Pruritus, unspecified: Secondary | ICD-10-CM | POA: Diagnosis not present

## 2021-02-11 DIAGNOSIS — N1831 Chronic kidney disease, stage 3a: Secondary | ICD-10-CM | POA: Diagnosis not present

## 2021-02-11 DIAGNOSIS — D696 Thrombocytopenia, unspecified: Secondary | ICD-10-CM | POA: Diagnosis not present

## 2021-02-11 DIAGNOSIS — Z6832 Body mass index (BMI) 32.0-32.9, adult: Secondary | ICD-10-CM | POA: Diagnosis not present

## 2021-02-11 DIAGNOSIS — E1122 Type 2 diabetes mellitus with diabetic chronic kidney disease: Secondary | ICD-10-CM | POA: Diagnosis not present

## 2021-02-11 DIAGNOSIS — I129 Hypertensive chronic kidney disease with stage 1 through stage 4 chronic kidney disease, or unspecified chronic kidney disease: Secondary | ICD-10-CM | POA: Diagnosis not present

## 2021-02-11 DIAGNOSIS — Z139 Encounter for screening, unspecified: Secondary | ICD-10-CM | POA: Diagnosis not present

## 2021-02-11 DIAGNOSIS — Z1331 Encounter for screening for depression: Secondary | ICD-10-CM | POA: Diagnosis not present

## 2021-02-11 DIAGNOSIS — E782 Mixed hyperlipidemia: Secondary | ICD-10-CM | POA: Diagnosis not present

## 2021-02-11 DIAGNOSIS — Z23 Encounter for immunization: Secondary | ICD-10-CM | POA: Diagnosis not present

## 2021-02-11 DIAGNOSIS — Z9181 History of falling: Secondary | ICD-10-CM | POA: Diagnosis not present

## 2021-02-26 DIAGNOSIS — N183 Chronic kidney disease, stage 3 unspecified: Secondary | ICD-10-CM | POA: Diagnosis not present

## 2021-03-03 DIAGNOSIS — E1122 Type 2 diabetes mellitus with diabetic chronic kidney disease: Secondary | ICD-10-CM | POA: Diagnosis not present

## 2021-03-03 DIAGNOSIS — N200 Calculus of kidney: Secondary | ICD-10-CM | POA: Diagnosis not present

## 2021-03-03 DIAGNOSIS — N183 Chronic kidney disease, stage 3 unspecified: Secondary | ICD-10-CM | POA: Diagnosis not present

## 2021-03-05 ENCOUNTER — Telehealth: Payer: Self-pay | Admitting: Hematology and Oncology

## 2021-03-05 NOTE — Telephone Encounter (Signed)
Scheduled appt per 12/30 referral. Pt is aware of appt date and time. Pt is aware to arrive 15 mins prior to appt.

## 2021-03-11 ENCOUNTER — Other Ambulatory Visit: Payer: Self-pay

## 2021-03-11 DIAGNOSIS — E785 Hyperlipidemia, unspecified: Secondary | ICD-10-CM | POA: Insufficient documentation

## 2021-03-11 DIAGNOSIS — N200 Calculus of kidney: Secondary | ICD-10-CM | POA: Insufficient documentation

## 2021-03-11 DIAGNOSIS — E781 Pure hyperglyceridemia: Secondary | ICD-10-CM | POA: Insufficient documentation

## 2021-03-11 DIAGNOSIS — M109 Gout, unspecified: Secondary | ICD-10-CM | POA: Insufficient documentation

## 2021-03-11 DIAGNOSIS — I10 Essential (primary) hypertension: Secondary | ICD-10-CM | POA: Insufficient documentation

## 2021-03-11 DIAGNOSIS — N62 Hypertrophy of breast: Secondary | ICD-10-CM | POA: Insufficient documentation

## 2021-03-11 DIAGNOSIS — L259 Unspecified contact dermatitis, unspecified cause: Secondary | ICD-10-CM | POA: Insufficient documentation

## 2021-03-11 DIAGNOSIS — E538 Deficiency of other specified B group vitamins: Secondary | ICD-10-CM | POA: Insufficient documentation

## 2021-03-11 DIAGNOSIS — J309 Allergic rhinitis, unspecified: Secondary | ICD-10-CM | POA: Insufficient documentation

## 2021-03-11 DIAGNOSIS — G5600 Carpal tunnel syndrome, unspecified upper limb: Secondary | ICD-10-CM | POA: Insufficient documentation

## 2021-03-11 DIAGNOSIS — E669 Obesity, unspecified: Secondary | ICD-10-CM | POA: Insufficient documentation

## 2021-03-11 DIAGNOSIS — Z87442 Personal history of urinary calculi: Secondary | ICD-10-CM | POA: Insufficient documentation

## 2021-03-11 DIAGNOSIS — E119 Type 2 diabetes mellitus without complications: Secondary | ICD-10-CM | POA: Insufficient documentation

## 2021-03-13 ENCOUNTER — Inpatient Hospital Stay: Payer: Medicare Other | Attending: Hematology and Oncology | Admitting: Hematology and Oncology

## 2021-03-13 ENCOUNTER — Encounter: Payer: Self-pay | Admitting: Hematology and Oncology

## 2021-03-13 ENCOUNTER — Inpatient Hospital Stay: Payer: Medicare Other

## 2021-03-13 ENCOUNTER — Other Ambulatory Visit: Payer: Self-pay

## 2021-03-13 ENCOUNTER — Other Ambulatory Visit: Payer: Self-pay | Admitting: Hematology and Oncology

## 2021-03-13 ENCOUNTER — Telehealth: Payer: Self-pay | Admitting: Hematology and Oncology

## 2021-03-13 VITALS — BP 146/77 | HR 64 | Temp 97.5°F | Resp 18 | Ht 67.7 in | Wt 218.9 lb

## 2021-03-13 DIAGNOSIS — D696 Thrombocytopenia, unspecified: Secondary | ICD-10-CM | POA: Diagnosis not present

## 2021-03-13 DIAGNOSIS — D649 Anemia, unspecified: Secondary | ICD-10-CM | POA: Diagnosis not present

## 2021-03-13 DIAGNOSIS — Z7984 Long term (current) use of oral hypoglycemic drugs: Secondary | ICD-10-CM | POA: Insufficient documentation

## 2021-03-13 DIAGNOSIS — Z79899 Other long term (current) drug therapy: Secondary | ICD-10-CM | POA: Diagnosis not present

## 2021-03-13 LAB — CBC AND DIFFERENTIAL
HCT: 43 (ref 41–53)
Hemoglobin: 13.7 (ref 13.5–17.5)
MCV: 92 (ref 80–94)
Neutrophils Absolute: 1.97
Platelets: 73 — AB (ref 150–399)
WBC: 4.7

## 2021-03-13 LAB — CBC
Absolute Lymphocytes: 1.97 (ref 0.65–4.75)
Band Neutrophils: 4 % (ref 0–5)
LYMPH%: 42 % (ref 17–44)
MONO%: 10 % (ref 0–10)
NEUT%: 44 % — AB (ref 45–76)
RBC: 4.61 (ref 3.87–5.11)

## 2021-03-13 LAB — VITAMIN B12: Vitamin B-12: 3020 pg/mL — ABNORMAL HIGH (ref 180–914)

## 2021-03-13 LAB — FOLATE: Folate: 7.5 ng/mL (ref >5.9)

## 2021-03-13 NOTE — Telephone Encounter (Signed)
Scheduled pt's next appt per 03/13/21 LOS. Pt given appt date and time.

## 2021-03-13 NOTE — Progress Notes (Signed)
[facility]  [address] [place],  [place]  [address] [phone]  Clinic Day:  03/13/2021  Referring physician: Ocie Doyne., MD   REASON FOR CONSULTATION:  Thrombocytopenia  HISTORY OF PRESENT ILLNESS:  Adam Mata is a 81 y.o. male with a history of thrombocytopenia who is referred in consultation by [name], PA-C for assessment and management.  The patient gives a history of mild thrombocytopenia dating back to September 2021 when his platelet count was 87,000.  Prior to that, it had been normal.  In January 2022 the platelet count was 95,000, then in July platelet count was 68,000.  He had persistent thrombocytopenia in December with a platelet count of 68,000.  White blood count was 4.7 with an ANC of 2.5 and normal differential.  Hemoglobin was 13.6 (13 being normal at [facility]) with an MCV of 90.  He reports easy bruising with points out peripheral of his right arm likely due to injury from his dog.  He denies abnormal bleeding.  He states that an ultrasound of his liver about 6 years ago revealed fatty liver.  He quit drinking alcohol 47 years ago, but was a very heavy drinker for 6 to 7 years prior to that.  He states he uses 3 Tylenol arthritis usually once a day for his chronic neck pain. The patient states he has been taking oral B12 for about 6 months, most recently a liquid formulation of 5000 micrograms daily.  REVIEW OF SYSTEMS:  Review of Systems  Constitutional:  Negative for appetite change, chills, fatigue, fever and unexpected weight change.  HENT:   Negative for lump/mass, mouth sores and sore throat.   Respiratory:  Negative for cough and shortness of breath.   Cardiovascular:  Negative for chest pain and leg swelling.  Gastrointestinal:  Negative for abdominal pain, constipation, diarrhea, nausea and vomiting.  Genitourinary:  Negative for difficulty urinating, dysuria, frequency and hematuria.   Musculoskeletal:  Positive  for neck pain. Negative for arthralgias, back pain and myalgias.  Skin:  Negative for itching, rash and wound.  Neurological:  Negative for dizziness, extremity weakness, headaches, light-headedness and numbness.  Hematological:  Negative for adenopathy. Bruises/bleeds easily.  Psychiatric/Behavioral:  Negative for depression and sleep disturbance. The patient is not nervous/anxious.     VITALS:  Blood pressure (!) 146/77, pulse 64, temperature (!) 97.5 F (36.4 C), temperature source Oral, resp. rate 18, height 5' 7.7" (1.72 m), weight 218 lb 14.4 oz (99.3 kg), SpO2 98 %.  Wt Readings from Last 3 Encounters:  03/13/21 218 lb 14.4 oz (99.3 kg)  06/24/20 212 lb 1.6 oz (96.2 kg)  09/19/12 250 lb 6 oz (113.6 kg)    Body mass index is 33.58 kg/m.  Performance status (ECOG): 0 - Asymptomatic  PHYSICAL EXAM:  Physical Exam Vitals and nursing note reviewed.  Constitutional:      General: He is not in acute distress.    Appearance: Normal appearance. He is normal weight.  HENT:     Head: Normocephalic and atraumatic.     Mouth/Throat:     Mouth: Mucous membranes are moist.     Pharynx: Oropharynx is clear. No oropharyngeal exudate or posterior oropharyngeal erythema.  Eyes:     General: No scleral icterus.    Extraocular Movements: Extraocular movements intact.     Conjunctiva/sclera: Conjunctivae normal.     Pupils: Pupils are equal, round, and reactive to light.  Cardiovascular:     Rate and Rhythm: Normal  rate and regular rhythm.     Heart sounds: Normal heart sounds. No murmur heard.   No friction rub. No gallop.  Pulmonary:     Effort: Pulmonary effort is normal.     Breath sounds: Normal breath sounds. No wheezing, rhonchi or rales.  Abdominal:     General: Bowel sounds are normal. There is no distension.     Palpations: Abdomen is soft. There is no hepatomegaly, splenomegaly or mass.     Tenderness: There is no abdominal tenderness.  Musculoskeletal:        General:  Normal range of motion.     Cervical back: Normal range of motion and neck supple. No tenderness.     Right lower leg: No edema.     Left lower leg: No edema.  Lymphadenopathy:     Cervical: No cervical adenopathy.     Upper Body:     Right upper body: No supraclavicular or axillary adenopathy.     Left upper body: No supraclavicular or axillary adenopathy.     Lower Body: No right inguinal adenopathy. No left inguinal adenopathy.  Skin:    General: Skin is warm and dry.     Coloration: Skin is not jaundiced.     Findings: No rash. Papular rash: Scattered purpura of the right distal upper extremity.  Neurological:     Mental Status: He is alert and oriented to person, place, and time.     Cranial Nerves: No cranial nerve deficit.  Psychiatric:        Mood and Affect: Mood normal.        Behavior: Behavior normal.        Thought Content: Thought content normal.     LABS:   CBC Latest Ref Rng & Units 03/13/2021 06/24/2020 09/19/2012  WBC - 4.7 - -  Hemoglobin 13.5 - 17.5 13.7 12.6(L) 15.3  Hematocrit 41 - 53 43 37.0(L) 45.0  Platelets 150 - 399 73(A) - -   CMP Latest Ref Rng & Units 06/24/2020 09/19/2012 07/19/2012  Glucose 70 - 99 mg/dL 119(H) 133(H) 123(H)  BUN 8 - 23 mg/dL 17 15 22   Creatinine 0.61 - 1.24 mg/dL 1.20 1.00 1.37(H)  Sodium 135 - 145 mmol/L 141 140 139  Potassium 3.5 - 5.1 mmol/L 4.1 4.0 4.7  Chloride 98 - 111 mmol/L 104 105 101  CO2 19 - 32 mEq/L - - 24  Calcium 8.4 - 10.5 mg/dL - - 9.6     Peripheral smear atypical lymphs.  No results found for: CEA1 / No results found for: CEA1 No results found for: PSA1 No results found for: FAO130 No results found for: CAN125  No results found for: TOTALPROTELP, ALBUMINELP, A1GS, A2GS, BETS, BETA2SER, GAMS, MSPIKE, SPEI No results found for: TIBC, FERRITIN, IRONPCTSAT No results found for: LDH  STUDIES:  No results found.    HISTORY:   Past Medical History:  Diagnosis Date   Arthritis    CKD (chronic kidney  disease), stage III [facility])    nephrologist--- dr Joelyn [facility])   History of gout    per pt last episode many yrs ago   History of kidney stones    Hyperlipidemia    Hypertension    followed by pcp   Rash    per pt buttock area   Right ureteral calculus    Type 2 diabetes mellitus ([place])    followed by pcp   (06-17-2020 pt stated does not check blood sugar at home)   Vitamin  B12 deficiency    Wears glasses     Past Surgical History:  Procedure Laterality Date   CARPAL TUNNEL RELEASE Right 07/25/2012   Procedure: CARPAL TUNNEL RELEASE;  Surgeon: Cammie Sickle., MD;  Location: [facility];  Service: Orthopedics;  Laterality: Right;   CARPAL TUNNEL RELEASE Left 09/19/2012   Procedure: LEFT CARPAL TUNNEL RELEASE;  Surgeon: Cammie Sickle., MD;  Location: [facility];  Service: Orthopedics;  Laterality: Left;   CATARACT EXTRACTION W/ INTRAOCULAR LENS IMPLANT Right 2017   COLONOSCOPY     CYSTOSCOPY/RETROGRADE/URETEROSCOPY/STONE EXTRACTION WITH BASKET Right 06/24/2020   Procedure: CYSTOSCOPY/bILATERAL [facility] Jan /URETEROSCOPY/HOLMIUM LASER RIGHT  LITHOTRIPSY/RIGHT STONE EXTRACTION WITH BASKET/ STENT PLACEMENT;  Surgeon: Festus Aloe, MD;  Location[facility];  Service: Urology;  Laterality: Right;   CYSTOSCOPY/URETEROSCOPY/HOLMIUM LASER/STENT PLACEMENT  x3  last one 01-22-2020   TONSILLECTOMY  child   TRIGGER FINGER RELEASE Left 09/19/2012   Procedure: RELEASE TRIGGER FINGER/A-1 PULLEY LEFT LONG FINGER;  Surgeon: Cammie Sickle., MD;  Location: [facility];  Service: Orthopedics;  Laterality: Left;    Family History  Problem Relation Age of Onset   Stroke Mother    Arthritis Mother    Heart attack Mother    Heart attack Father    Stroke Father     Social History:  reports that he has never smoked. He has never used smokeless tobacco. He reports that he does not currently use alcohol. He reports  that he does not use drugs.  He did use alcohol quite heavily for 6 to 7 years, but quit 47 years ago.  The patient is alone today.  He was born in [place], but has lived in multiple different states over the years. He is married with no children.  He has been in [place] since 1987.  He has worked with [employer], [employer] and in Advertising account planner.  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  He denies chemical or radiation exposure.  Allergies:  Allergies  Allergen Reactions   Fluvastatin    Lovastatin    Pravastatin Other (See Comments)   Simvastatin Other (See Comments)   Tamsulosin Other (See Comments)    Current Medications: Current Outpatient Medications  Medication Sig Dispense Refill   triamcinolone cream (KENALOG) 0.5 % APPLY SMALL AMOUNT TO AFFECTED AREA TWICE A DAY . DO NOT APPLY ON FACE.     acetaminophen (TYLENOL) 500 MG tablet Take 500 mg by mouth every 6 (six) hours as needed.     fluticasone (FLONASE) 50 MCG/ACT nasal spray INSTILL 1 SPRAY IN EACH NOSTRIL TWICE A DAY (CONVERTED FROM FLUNISOLIDE NASAL)     metFORMIN (GLUCOPHAGE-XR) 750 MG 24 hr tablet Take 750 mg by mouth 2 (two) times daily.     potassium citrate (UROCIT-K) 5 MEQ (540 MG) SR tablet Take 15 mEq by mouth 2 (two) times daily.     No current facility-administered medications for this visit.     ASSESSMENT & PLAN:   Assessment:  Gedeon Brandow is a 81 y.o. male with chronic mild thrombocytopenia dating back at least until September 2021.  He also has borderline anemia.  His white blood count and differential is normal, but some atypical lymphocytes were seen.  Plan: 1.  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  His B12 level was elevated, so I recommended he decrease his B12 supplement to 1000 mcg daily.  Folic  acid was normal.  We recommend continued observation. 2.  Borderline anemia.  If this worsens, we will evaluate further. 3.   Atypical lymphocytes on peripheral smear without blasts.  This can be a sign of chronic lymphocytic leukemia.  We will continue to monitor this.  I discussed the assessment and treatment plan with the patient.  The patient was provided an opportunity to ask questions and all were answered.  The patient agreed with the plan and demonstrated an understanding of the instructions.  The patient was advised to call back if the symptoms worsen or if the condition fails to improve as anticipated.  We will plan to see him back in 3 months with a CBC for repeat clinical assessment.  Thank you for the opportunity    I provided 40 minutes of face-to-face time during this encounter and > 50% was spent counseling as documented under my assessment and plan.    Adam Pickles, PA-C

## 2021-03-16 ENCOUNTER — Encounter: Payer: Self-pay | Admitting: Hematology and Oncology

## 2021-03-16 ENCOUNTER — Telehealth: Payer: Self-pay

## 2021-03-16 NOTE — Telephone Encounter (Signed)
Wife aware

## 2021-03-16 NOTE — Telephone Encounter (Signed)
-----   Message from Marvia Pickles, PA-C sent at 03/16/2021 10:18 AM EST ----- Please let him know that his B12 level is quite high. He probably only needs 1000 mcg daily. Folic acid was normal. Thanks!

## 2021-04-28 DIAGNOSIS — E782 Mixed hyperlipidemia: Secondary | ICD-10-CM | POA: Diagnosis not present

## 2021-04-28 DIAGNOSIS — E1122 Type 2 diabetes mellitus with diabetic chronic kidney disease: Secondary | ICD-10-CM | POA: Diagnosis not present

## 2021-05-01 DIAGNOSIS — N200 Calculus of kidney: Secondary | ICD-10-CM | POA: Diagnosis not present

## 2021-05-12 DIAGNOSIS — M542 Cervicalgia: Secondary | ICD-10-CM | POA: Diagnosis not present

## 2021-05-12 DIAGNOSIS — R42 Dizziness and giddiness: Secondary | ICD-10-CM | POA: Diagnosis not present

## 2021-05-12 DIAGNOSIS — R590 Localized enlarged lymph nodes: Secondary | ICD-10-CM | POA: Diagnosis not present

## 2021-05-12 DIAGNOSIS — Z6833 Body mass index (BMI) 33.0-33.9, adult: Secondary | ICD-10-CM | POA: Diagnosis not present

## 2021-06-12 ENCOUNTER — Telehealth: Payer: Self-pay | Admitting: Oncology

## 2021-06-12 ENCOUNTER — Other Ambulatory Visit: Payer: Self-pay | Admitting: Oncology

## 2021-06-12 ENCOUNTER — Other Ambulatory Visit: Payer: Medicare Other

## 2021-06-12 ENCOUNTER — Ambulatory Visit: Payer: Medicare Other | Admitting: Oncology

## 2021-06-12 DIAGNOSIS — D696 Thrombocytopenia, unspecified: Secondary | ICD-10-CM

## 2021-06-12 NOTE — Progress Notes (Deleted)
?[name]  ?[address] ?[place],  [place]  [address] ?[phone] ? ?Clinic Day:  06/12/2021 ? ?Referring physician: Darrol Jump, PA-C ? ? ?REASON FOR CONSULTATION:  ?Thrombocytopenia ? ?HISTORY OF PRESENT ILLNESS:  ?Adam Mata is a 81 y.o. male with a history of thrombocytopenia who is referred in consultation by [name], PA-C for assessment and management.  The patient gives a history of mild thrombocytopenia dating back to September 2021 when his platelet count was 87,000.  Prior to that, it had been normal.  In January 2022 the platelet count was 95,000, then in July platelet count was 68,000.  He had persistent thrombocytopenia in December with a platelet count of 68,000.  White blood count was 4.7 with an ANC of 2.5 and normal differential.  Hemoglobin was 13.6 (13 being normal at [facility]) with an MCV of 90.  He reports easy bruising with points out peripheral of his right arm likely due to injury from his dog.  He denies abnormal bleeding.  He states that an ultrasound of his liver about 6 years ago revealed fatty liver.  He quit drinking alcohol 47 years ago, but was a very heavy drinker for 6 to 7 years prior to that.  He states he uses 3 Tylenol arthritis usually once a day for his chronic neck pain. The patient states he has been taking oral B12 for about 6 months, most recently a liquid formulation of 5000 micrograms daily. ? ?REVIEW OF SYSTEMS:  ?Review of Systems  ?Constitutional:  Negative for appetite change, chills, fatigue, fever and unexpected weight change.  ?HENT:   Negative for lump/mass, mouth sores and sore throat.   ?Respiratory:  Negative for cough and shortness of breath.   ?Cardiovascular:  Negative for chest pain and leg swelling.  ?Gastrointestinal:  Negative for abdominal pain, constipation, diarrhea, nausea and vomiting.  ?Genitourinary:  Negative for difficulty urinating, dysuria, frequency and hematuria.   ?Musculoskeletal:   Negative for arthralgias, back pain and myalgias.  ?Skin:  Negative for itching, rash and wound.  ?Neurological:  Negative for dizziness, extremity weakness, headaches, light-headedness and numbness.  ?Hematological:  Negative for adenopathy. Bruises/bleeds easily.  ?Psychiatric/Behavioral:  Negative for depression and sleep disturbance. The patient is not nervous/anxious.    ? ?VITALS:  ?There were no vitals taken for this visit.  ?Wt Readings from Last 3 Encounters:  ?03/13/21 218 lb 14.4 oz (99.3 kg)  ?06/24/20 212 lb 1.6 oz (96.2 kg)  ?09/19/12 250 lb 6 oz (113.6 kg)  ?  ?There is no height or weight on file to calculate BMI. ? ?Performance status (ECOG): 0 - Asymptomatic ? ?PHYSICAL EXAM:  ?Physical Exam ?Vitals and nursing note reviewed.  ?Constitutional:   ?   General: He is not in acute distress. ?   Appearance: Normal appearance. He is normal weight.  ?HENT:  ?   Head: Normocephalic and atraumatic.  ?   Mouth/Throat:  ?   Mouth: Mucous membranes are moist.  ?   Pharynx: Oropharynx is clear. No oropharyngeal exudate or posterior oropharyngeal erythema.  ?Eyes:  ?   General: No scleral icterus. ?   Extraocular Movements: Extraocular movements intact.  ?   Conjunctiva/sclera: Conjunctivae normal.  ?   Pupils: Pupils are equal, round, and reactive to light.  ?Cardiovascular:  ?   Rate and Rhythm: Normal rate and regular rhythm.  ?   Heart sounds: Normal heart sounds. No murmur heard. ?  No friction rub. No gallop.  ?Pulmonary:  ?  Effort: Pulmonary effort is normal.  ?   Breath sounds: Normal breath sounds. No wheezing, rhonchi or rales.  ?Abdominal:  ?   General: Bowel sounds are normal. There is no distension.  ?   Palpations: Abdomen is soft. There is no hepatomegaly, splenomegaly or mass.  ?   Tenderness: There is no abdominal tenderness.  ?Musculoskeletal:     ?   General: Normal range of motion.  ?   Cervical back: Normal range of motion and neck supple. No tenderness.  ?   Right lower leg: No edema.  ?    Left lower leg: No edema.  ?Lymphadenopathy:  ?   Cervical: No cervical adenopathy.  ?   Upper Body:  ?   Right upper body: No supraclavicular or axillary adenopathy.  ?   Left upper body: No supraclavicular or axillary adenopathy.  ?   Lower Body: No right inguinal adenopathy. No left inguinal adenopathy.  ?Skin: ?   General: Skin is warm and dry.  ?   Coloration: Skin is not jaundiced.  ?   Findings: No rash. Papular rash: Scattered purpura of the right distal upper extremity.  ?Neurological:  ?   Mental Status: He is alert and oriented to person, place, and time.  ?   Cranial Nerves: No cranial nerve deficit.  ?Psychiatric:     ?   Mood and Affect: Mood normal.     ?   Behavior: Behavior normal.     ?   Thought Content: Thought content normal.  ? ? ? ?LABS:  ? ? ?  Latest Ref Rng & Units 03/13/2021  ? 12:00 AM 06/24/2020  ?  6:36 AM 09/19/2012  ?  9:14 AM  ?CBC  ?WBC  4.7      ?Hemoglobin 13.5 - 17.5 13.7   12.6   15.3    ?Hematocrit 41 - 53 43   37.0   45.0    ?Platelets 150 - 399 73      ? ? ?  Latest Ref Rng & Units 06/24/2020  ?  6:36 AM 09/19/2012  ?  9:14 AM 07/19/2012  ?  3:00 PM  ?CMP  ?Glucose 70 - 99 mg/dL 119   133   123    ?BUN 8 - 23 mg/dL '17   15   22    '$ ?Creatinine 0.61 - 1.24 mg/dL 1.20   1.00   1.37    ?Sodium 135 - 145 mmol/L 141   140   139    ?Potassium 3.5 - 5.1 mmol/L 4.1   4.0   4.7    ?Chloride 98 - 111 mmol/L 104   105   101    ?CO2 19 - 32 mEq/L   24    ?Calcium 8.4 - 10.5 mg/dL   9.6    ? ? ? ?Peripheral smear atypical lymphs. ? ?No results found for: CEA1 / No results found for: CEA1 ?No results found for: PSA1 ?No results found for: XBD532 ?No results found for: DJM426  ?No results found for: TOTALPROTELP, ALBUMINELP, A1GS, A2GS, BETS, BETA2SER, GAMS, MSPIKE, SPEI ?No results found for: TIBC, FERRITIN, IRONPCTSAT ?No results found for: LDH ? ?STUDIES:  ?No results found.  ? ? ?HISTORY:  ? ?Past Medical History:  ?Diagnosis Date  ?? Arthritis   ?? CKD (chronic kidney disease), stage III  ([facility])   ? nephrologist--- dr Joelyn [facility])  ?? History of gout   ? per pt last episode many yrs ago  ??  History of kidney stones   ?? Hyperlipidemia   ?? Hypertension   ? followed by pcp  ?? Rash   ? per pt buttock area  ?? Right ureteral calculus   ?? Type 2 diabetes mellitus ([facility])   ? followed by pcp   (06-17-2020 pt stated does not check blood sugar at home)  ?? Vitamin B12 deficiency   ?? Wears glasses   ? ? ?Past Surgical History:  ?Procedure Laterality Date  ?? CARPAL TUNNEL RELEASE Right 07/25/2012  ? Procedure: CARPAL TUNNEL RELEASE;  Surgeon: Cammie Sickle., MD;  Location: [facility];  Service: Orthopedics;  Laterality: Right;  ?? CARPAL TUNNEL RELEASE Left 09/19/2012  ? Procedure: LEFT CARPAL TUNNEL RELEASE;  Surgeon: Cammie Sickle., MD;  Location: [facility];  Service: Orthopedics;  Laterality: Left;  ?? CATARACT EXTRACTION W/ INTRAOCULAR LENS IMPLANT Right 2017  ?? COLONOSCOPY    ?? CYSTOSCOPY/RETROGRADE/URETEROSCOPY/STONE EXTRACTION WITH BASKET Right 06/24/2020  ? Procedure: CYSTOSCOPY/bILATERAL Chaya Jan /URETEROSCOPY/HOLMIUM LASER RIGHT  LITHOTRIPSY/RIGHT STONE EXTRACTION WITH BASKET/ STENT PLACEMENT;  Surgeon: Festus Aloe, MD;  Location[facility];  Service: Urology;  Laterality: Right;  ?? CYSTOSCOPY/URETEROSCOPY/HOLMIUM LASER/STENT PLACEMENT  x3  last one 01-22-2020  ?? TONSILLECTOMY  child  ?? TRIGGER FINGER RELEASE Left 09/19/2012  ? Procedure: RELEASE TRIGGER FINGER/A-1 PULLEY LEFT LONG FINGER;  Surgeon: Cammie Sickle., MD;  Location: [facility];  Service: Orthopedics;  Laterality: Left;  ? ? ?Family History  ?Problem Relation Age of Onset  ?? Stroke Mother   ?? Arthritis Mother   ?? Heart attack Mother   ?? Heart attack Father   ?? Stroke Father   ? ? ?Social History:  reports that he has never smoked. He has never used smokeless tobacco. He reports that he does not currently use alcohol. He  reports that he does not use drugs.  He did use alcohol quite heavily for 6 to 7 years, but quit 47 years ago.  The patient is alone today.  He was born in [place], but has lived in multiple different states over

## 2021-06-12 NOTE — Telephone Encounter (Signed)
Patient rescheduled from today to  ?

## 2021-06-25 NOTE — Progress Notes (Signed)
?[facility]  ?[address] ?[place],  [place]  [address] ?[phone] ? ?Clinic Day:  06/26/21 ? ?Referring physician: Darrol Jump, PA-C ? ? ?Chief complaint:  ?Thrombocytopenia ? ?HISTORY OF PRESENT ILLNESS:  ?Adam Mata is a 81 y.o. male with a history of thrombocytopenia who is referred in consultation by [name], PA-C for assessment and management.  The patient gives a history of mild thrombocytopenia dating back to September 2021 when his platelet count was 87,000.  Prior to that, it had been normal.  In January 2022 the platelet count was 95,000, then in July platelet count was 68,000.  He had persistent thrombocytopenia in December with a platelet count of 68,000.  White blood count was 4.7 with an ANC of 2.5 and normal differential.  Hemoglobin was 13.6 (13 being normal at [facility]) with an MCV of 90.  He reports easy bruising with points out peripheral of his right arm likely due to injury from his dog.  He denies abnormal bleeding.  He states that an ultrasound of his liver about 6 years ago revealed fatty liver.  He quit drinking alcohol 47 years ago, but was a very heavy drinker for 6 to 7 years prior to that.  He states he uses 3 Tylenol arthritis usually once a day for his chronic neck pain. The patient states he has been taking oral B12 for about 6 months, most recently a liquid formulation of 5000 micrograms daily.  He was evaluated in December 2022 and his platelets were stable at 68,000.  His white count was 4.7 with an ANC of 2500 and normal differential.  Hemoglobin was 13.6 and MCV is 90.  In January 2023 his platelet count was down to 73,000. ? ?INTERVAL HISTORY: ?He comes in for follow-up and evaluation in January was unrevealing.  His B12 was over 3000 and his folate level was normal.  He had some atypical lymphocytes and high normal absolute lymphocyte count.  His only complaint is that his appetite is decreased but he denies any pain,  adenopathy, early satiety, or overt bleeding.  He sees Darrol Jump, [place] every 6 months and she will be checking him again in the summer.  He also goes to the [facility] in [place] for his medications and checkups every 6 months.  He sees Dr. Beatriz Chancellor with the [facility] team. ? ?REVIEW OF SYSTEMS:  ?Review of Systems  ?Constitutional:  Negative for appetite change, chills, fatigue, fever and unexpected weight change.  ?HENT:   Negative for lump/mass, mouth sores and sore throat.   ?Respiratory:  Negative for cough and shortness of breath.   ?Cardiovascular:  Negative for chest pain and leg swelling.  ?Gastrointestinal:  Negative for abdominal pain, constipation, diarrhea, nausea and vomiting.  ?Genitourinary:  Negative for difficulty urinating, dysuria, frequency and hematuria.   ?Musculoskeletal:  Negative for arthralgias, back pain and myalgias.  ?Skin:  Negative for itching, rash and wound.  ?Neurological:  Negative for dizziness, extremity weakness, headaches, light-headedness and numbness.  ?Hematological:  Negative for adenopathy. Bruises/bleeds easily.  ?Psychiatric/Behavioral:  Negative for depression and sleep disturbance. The patient is not nervous/anxious.    ? ?VITALS:  ?Blood pressure (!) 143/79, pulse 93, temperature 98 ?F (36.7 ?C), temperature source Oral, resp. rate 18, height 5' 7.3" (1.709 m), weight 221 lb 11.2 oz (100.6 kg), SpO2 96 %.  ?Wt Readings from Last 3 Encounters:  ?06/26/21 221 lb 11.2 oz (100.6 kg)  ?03/13/21 218 lb 14.4 oz (99.3 kg)  ?06/24/20  212 lb 1.6 oz (96.2 kg)  ?  ?Body mass index is 34.41 kg/m?. ? ?Performance status (ECOG): 0 - Asymptomatic ? ?PHYSICAL EXAM:  ?Physical Exam ?Vitals and nursing note reviewed.  ?Constitutional:   ?   General: He is not in acute distress. ?   Appearance: Normal appearance. He is normal weight.  ?HENT:  ?   Head: Normocephalic and atraumatic.  ?   Mouth/Throat:  ?   Mouth: Mucous membranes are moist.  ?   Pharynx: Oropharynx is clear. No oropharyngeal  exudate or posterior oropharyngeal erythema.  ?Eyes:  ?   General: No scleral icterus. ?   Extraocular Movements: Extraocular movements intact.  ?   Conjunctiva/sclera: Conjunctivae normal.  ?   Pupils: Pupils are equal, round, and reactive to light.  ?Cardiovascular:  ?   Rate and Rhythm: Normal rate and regular rhythm.  ?   Heart sounds: Normal heart sounds. No murmur heard. ?  No friction rub. No gallop.  ?Pulmonary:  ?   Effort: Pulmonary effort is normal.  ?   Breath sounds: Normal breath sounds. No wheezing, rhonchi or rales.  ?Abdominal:  ?   General: Bowel sounds are normal. There is no distension.  ?   Palpations: Abdomen is soft. There is no hepatomegaly, splenomegaly or mass.  ?   Tenderness: There is no abdominal tenderness.  ?Musculoskeletal:     ?   General: Normal range of motion.  ?   Cervical back: Normal range of motion and neck supple. No tenderness.  ?   Right lower leg: No edema.  ?   Left lower leg: No edema.  ?Lymphadenopathy:  ?   Cervical: No cervical adenopathy.  ?   Upper Body:  ?   Right upper body: No supraclavicular or axillary adenopathy.  ?   Left upper body: No supraclavicular or axillary adenopathy.  ?   Lower Body: No right inguinal adenopathy. No left inguinal adenopathy.  ?Skin: ?   General: Skin is warm and dry.  ?   Coloration: Skin is not jaundiced.  ?   Findings: No rash. Papular rash: Scattered purpura of the right distal upper extremity.  ?Neurological:  ?   Mental Status: He is alert and oriented to person, place, and time.  ?   Cranial Nerves: No cranial nerve deficit.  ?Psychiatric:     ?   Mood and Affect: Mood normal.     ?   Behavior: Behavior normal.     ?   Thought Content: Thought content normal.  ? ? ? ?LABS:  ? ? ?  Latest Ref Rng & Units 06/26/2021  ? 12:00 AM 03/13/2021  ? 12:00 AM 06/24/2020  ?  6:36 AM  ?CBC  ?WBC  3.9      4.7     ?Hemoglobin 13.5 - 17.5 13.1      13.7   12.6    ?Hematocrit 41 - 53 41      43   37.0    ?Platelets 150 - 400 K/uL 69      73     ?   ? This result is from an external source.  ? ? ?  Latest Ref Rng & Units 06/24/2020  ?  6:36 AM 09/19/2012  ?  9:14 AM 07/19/2012  ?  3:00 PM  ?CMP  ?Glucose 70 - 99 mg/dL 119   133   123    ?BUN 8 - 23 mg/dL 17   15  22    ?Creatinine 0.61 - 1.24 mg/dL 1.20   1.00   1.37    ?Sodium 135 - 145 mmol/L 141   140   139    ?Potassium 3.5 - 5.1 mmol/L 4.1   4.0   4.7    ?Chloride 98 - 111 mmol/L 104   105   101    ?CO2 19 - 32 mEq/L   24    ?Calcium 8.4 - 10.5 mg/dL   9.6    ? ? ? ?Peripheral smear atypical lymphs. ? ?No results found for: CEA1 / No results found for: CEA1 ?No results found for: PSA1 ?No results found for: EHU314 ?No results found for: HFW263  ?Lab Results  ?Component Value Date  ? TOTALPROTELP 6.9 06/26/2021  ? ALBUMINELP 4.1 06/26/2021  ? A1GS 0.2 06/26/2021  ? A2GS 0.8 06/26/2021  ? BETS 1.0 06/26/2021  ? GAMS 0.9 06/26/2021  ? MSPIKE Not Observed 06/26/2021  ? SPEI Comment 06/26/2021  ? ?No results found for: TIBC, FERRITIN, IRONPCTSAT ?No results found for: LDH ? ?STUDIES:  ?No results found.  ? ? ?HISTORY:  ? ?Past Medical History:  ?Diagnosis Date  ? Arthritis   ? CKD (chronic kidney disease), stage III ([facility])   ? nephrologist--- dr Joelyn [facility])  ? History of gout   ? per pt last episode many yrs ago  ? History of kidney stones   ? Hyperlipidemia   ? Hypertension   ? followed by pcp  ? Rash   ? per pt buttock area  ? Right ureteral calculus   ? Type 2 diabetes mellitus ([facility])   ? followed by pcp   (06-17-2020 pt stated does not check blood sugar at home)  ? Vitamin B12 deficiency   ? Wears glasses   ? ? ?Past Surgical History:  ?Procedure Laterality Date  ? CARPAL TUNNEL RELEASE Right 07/25/2012  ? Procedure: CARPAL TUNNEL RELEASE;  Surgeon: Cammie Sickle., MD;  Location: [facility];  Service: Orthopedics;  Laterality: Right;  ? CARPAL TUNNEL RELEASE Left 09/19/2012  ? Procedure: LEFT CARPAL TUNNEL RELEASE;  Surgeon: Cammie Sickle., MD;  Location: [facility];  Service: Orthopedics;  Laterality: Left;  ? CATARACT EXTRACTION W/ INTRAOCULAR LENS IMPLANT Right 2017  ? COLONOSCOPY    ? CYSTOSCOPY/RETROGRADE/URETEROSCOPY/STONE EXTRACTION WITH BASKET Right 4/26/

## 2021-06-26 ENCOUNTER — Other Ambulatory Visit: Payer: Self-pay | Admitting: Hematology and Oncology

## 2021-06-26 ENCOUNTER — Inpatient Hospital Stay (INDEPENDENT_AMBULATORY_CARE_PROVIDER_SITE_OTHER): Payer: Medicare Other | Admitting: Oncology

## 2021-06-26 ENCOUNTER — Other Ambulatory Visit: Payer: Self-pay | Admitting: Oncology

## 2021-06-26 ENCOUNTER — Encounter: Payer: Self-pay | Admitting: Oncology

## 2021-06-26 ENCOUNTER — Inpatient Hospital Stay: Payer: Medicare Other | Attending: Oncology

## 2021-06-26 ENCOUNTER — Other Ambulatory Visit: Payer: Self-pay

## 2021-06-26 VITALS — BP 143/79 | HR 93 | Temp 98.0°F | Resp 18 | Ht 67.3 in | Wt 221.7 lb

## 2021-06-26 DIAGNOSIS — D649 Anemia, unspecified: Secondary | ICD-10-CM | POA: Insufficient documentation

## 2021-06-26 DIAGNOSIS — D696 Thrombocytopenia, unspecified: Secondary | ICD-10-CM | POA: Diagnosis not present

## 2021-06-26 LAB — CBC AND DIFFERENTIAL
HCT: 41 (ref 41–53)
Hemoglobin: 13.1 — AB (ref 13.5–17.5)
Neutrophils Absolute: 1.64
Platelets: 69 10*3/uL — AB (ref 150–400)
WBC: 3.9

## 2021-06-26 LAB — CBC
MCV: 89 (ref 80–94)
RBC: 4.59 (ref 3.87–5.11)

## 2021-06-27 LAB — IGG, IGA, IGM
IgA: 275 mg/dL (ref 61–437)
IgG (Immunoglobin G), Serum: 882 mg/dL (ref 603–1613)
IgM (Immunoglobulin M), Srm: 43 mg/dL (ref 15–143)

## 2021-06-29 LAB — PROTEIN ELECTROPHORESIS, SERUM
A/G Ratio: 1.5 (ref 0.7–1.7)
Albumin ELP: 4.1 g/dL (ref 2.9–4.4)
Alpha-1-Globulin: 0.2 g/dL (ref 0.0–0.4)
Alpha-2-Globulin: 0.8 g/dL (ref 0.4–1.0)
Beta Globulin: 1 g/dL (ref 0.7–1.3)
Gamma Globulin: 0.9 g/dL (ref 0.4–1.8)
Globulin, Total: 2.8 g/dL (ref 2.2–3.9)
Total Protein ELP: 6.9 g/dL (ref 6.0–8.5)

## 2021-07-14 ENCOUNTER — Telehealth: Payer: Self-pay

## 2021-07-14 NOTE — Telephone Encounter (Signed)
-----   Message from Derwood Kaplan, MD sent at 07/12/2021  2:08 PM EDT ----- ?Regarding: note ?Pls send 4/28 note to Darrol Jump and to Dr. Beatriz Chancellor with the [facility] Team at [facility] ? ?

## 2021-07-14 NOTE — Telephone Encounter (Signed)
Faxed office note to Dr. Lodema Hong at [facility] and Darrol Jump PA in [place]. ?

## 2021-08-13 DIAGNOSIS — N1831 Chronic kidney disease, stage 3a: Secondary | ICD-10-CM | POA: Diagnosis not present

## 2021-08-13 DIAGNOSIS — D696 Thrombocytopenia, unspecified: Secondary | ICD-10-CM | POA: Diagnosis not present

## 2021-08-13 DIAGNOSIS — E1122 Type 2 diabetes mellitus with diabetic chronic kidney disease: Secondary | ICD-10-CM | POA: Diagnosis not present

## 2021-08-13 DIAGNOSIS — Z6832 Body mass index (BMI) 32.0-32.9, adult: Secondary | ICD-10-CM | POA: Diagnosis not present

## 2021-08-13 DIAGNOSIS — I129 Hypertensive chronic kidney disease with stage 1 through stage 4 chronic kidney disease, or unspecified chronic kidney disease: Secondary | ICD-10-CM | POA: Diagnosis not present

## 2021-08-13 DIAGNOSIS — E782 Mixed hyperlipidemia: Secondary | ICD-10-CM | POA: Diagnosis not present

## 2021-10-29 DIAGNOSIS — N1831 Chronic kidney disease, stage 3a: Secondary | ICD-10-CM | POA: Diagnosis not present

## 2021-10-29 DIAGNOSIS — E782 Mixed hyperlipidemia: Secondary | ICD-10-CM | POA: Diagnosis not present

## 2021-11-18 DIAGNOSIS — M25551 Pain in right hip: Secondary | ICD-10-CM | POA: Diagnosis not present

## 2021-11-18 DIAGNOSIS — M461 Sacroiliitis, not elsewhere classified: Secondary | ICD-10-CM | POA: Diagnosis not present

## 2021-11-18 DIAGNOSIS — M160 Bilateral primary osteoarthritis of hip: Secondary | ICD-10-CM | POA: Diagnosis not present

## 2021-11-18 DIAGNOSIS — M25561 Pain in right knee: Secondary | ICD-10-CM | POA: Diagnosis not present

## 2021-11-18 DIAGNOSIS — M47817 Spondylosis without myelopathy or radiculopathy, lumbosacral region: Secondary | ICD-10-CM | POA: Diagnosis not present

## 2021-11-19 DIAGNOSIS — Z6833 Body mass index (BMI) 33.0-33.9, adult: Secondary | ICD-10-CM | POA: Diagnosis not present

## 2021-11-19 DIAGNOSIS — R21 Rash and other nonspecific skin eruption: Secondary | ICD-10-CM | POA: Diagnosis not present

## 2021-11-19 DIAGNOSIS — M25561 Pain in right knee: Secondary | ICD-10-CM | POA: Diagnosis not present

## 2021-12-10 DIAGNOSIS — D485 Neoplasm of uncertain behavior of skin: Secondary | ICD-10-CM | POA: Diagnosis not present

## 2021-12-10 DIAGNOSIS — L0390 Cellulitis, unspecified: Secondary | ICD-10-CM | POA: Diagnosis not present

## 2021-12-10 DIAGNOSIS — L300 Nummular dermatitis: Secondary | ICD-10-CM | POA: Diagnosis not present

## 2021-12-10 DIAGNOSIS — L986 Other infiltrative disorders of the skin and subcutaneous tissue: Secondary | ICD-10-CM | POA: Diagnosis not present

## 2022-01-20 DIAGNOSIS — L0231 Cutaneous abscess of buttock: Secondary | ICD-10-CM | POA: Diagnosis not present

## 2022-02-10 DIAGNOSIS — N200 Calculus of kidney: Secondary | ICD-10-CM | POA: Diagnosis not present

## 2022-02-10 DIAGNOSIS — N401 Enlarged prostate with lower urinary tract symptoms: Secondary | ICD-10-CM | POA: Diagnosis not present

## 2022-02-10 DIAGNOSIS — R3912 Poor urinary stream: Secondary | ICD-10-CM | POA: Diagnosis not present

## 2022-02-15 DIAGNOSIS — E1122 Type 2 diabetes mellitus with diabetic chronic kidney disease: Secondary | ICD-10-CM | POA: Diagnosis not present

## 2022-02-15 DIAGNOSIS — N1831 Chronic kidney disease, stage 3a: Secondary | ICD-10-CM | POA: Diagnosis not present

## 2022-02-15 DIAGNOSIS — Z139 Encounter for screening, unspecified: Secondary | ICD-10-CM | POA: Diagnosis not present

## 2022-02-15 DIAGNOSIS — D696 Thrombocytopenia, unspecified: Secondary | ICD-10-CM | POA: Diagnosis not present

## 2022-02-15 DIAGNOSIS — E782 Mixed hyperlipidemia: Secondary | ICD-10-CM | POA: Diagnosis not present

## 2022-02-15 DIAGNOSIS — Z9181 History of falling: Secondary | ICD-10-CM | POA: Diagnosis not present

## 2022-02-15 DIAGNOSIS — Z6833 Body mass index (BMI) 33.0-33.9, adult: Secondary | ICD-10-CM | POA: Diagnosis not present

## 2022-02-15 DIAGNOSIS — I129 Hypertensive chronic kidney disease with stage 1 through stage 4 chronic kidney disease, or unspecified chronic kidney disease: Secondary | ICD-10-CM | POA: Diagnosis not present

## 2022-02-15 DIAGNOSIS — Z1331 Encounter for screening for depression: Secondary | ICD-10-CM | POA: Diagnosis not present

## 2022-02-15 DIAGNOSIS — Z125 Encounter for screening for malignant neoplasm of prostate: Secondary | ICD-10-CM | POA: Diagnosis not present

## 2022-03-16 DIAGNOSIS — Z23 Encounter for immunization: Secondary | ICD-10-CM | POA: Diagnosis not present

## 2022-04-15 DIAGNOSIS — N183 Chronic kidney disease, stage 3 unspecified: Secondary | ICD-10-CM | POA: Diagnosis not present

## 2022-04-19 DIAGNOSIS — I129 Hypertensive chronic kidney disease with stage 1 through stage 4 chronic kidney disease, or unspecified chronic kidney disease: Secondary | ICD-10-CM | POA: Diagnosis not present

## 2022-04-19 DIAGNOSIS — N183 Chronic kidney disease, stage 3 unspecified: Secondary | ICD-10-CM | POA: Diagnosis not present

## 2022-04-19 DIAGNOSIS — E1122 Type 2 diabetes mellitus with diabetic chronic kidney disease: Secondary | ICD-10-CM | POA: Diagnosis not present

## 2022-04-19 DIAGNOSIS — N200 Calculus of kidney: Secondary | ICD-10-CM | POA: Diagnosis not present

## 2022-05-18 DIAGNOSIS — E1122 Type 2 diabetes mellitus with diabetic chronic kidney disease: Secondary | ICD-10-CM | POA: Diagnosis not present

## 2022-05-18 DIAGNOSIS — E782 Mixed hyperlipidemia: Secondary | ICD-10-CM | POA: Diagnosis not present

## 2022-05-18 DIAGNOSIS — D696 Thrombocytopenia, unspecified: Secondary | ICD-10-CM | POA: Diagnosis not present

## 2022-05-18 DIAGNOSIS — N1831 Chronic kidney disease, stage 3a: Secondary | ICD-10-CM | POA: Diagnosis not present

## 2022-05-18 DIAGNOSIS — Z6832 Body mass index (BMI) 32.0-32.9, adult: Secondary | ICD-10-CM | POA: Diagnosis not present

## 2022-05-18 DIAGNOSIS — I129 Hypertensive chronic kidney disease with stage 1 through stage 4 chronic kidney disease, or unspecified chronic kidney disease: Secondary | ICD-10-CM | POA: Diagnosis not present

## 2022-08-09 DIAGNOSIS — R3121 Asymptomatic microscopic hematuria: Secondary | ICD-10-CM | POA: Diagnosis not present

## 2022-08-09 DIAGNOSIS — R3912 Poor urinary stream: Secondary | ICD-10-CM | POA: Diagnosis not present

## 2022-08-09 DIAGNOSIS — N401 Enlarged prostate with lower urinary tract symptoms: Secondary | ICD-10-CM | POA: Diagnosis not present

## 2022-08-09 DIAGNOSIS — N200 Calculus of kidney: Secondary | ICD-10-CM | POA: Diagnosis not present

## 2022-08-17 DIAGNOSIS — I129 Hypertensive chronic kidney disease with stage 1 through stage 4 chronic kidney disease, or unspecified chronic kidney disease: Secondary | ICD-10-CM | POA: Diagnosis not present

## 2022-08-17 DIAGNOSIS — N1831 Chronic kidney disease, stage 3a: Secondary | ICD-10-CM | POA: Diagnosis not present

## 2022-08-17 DIAGNOSIS — E1122 Type 2 diabetes mellitus with diabetic chronic kidney disease: Secondary | ICD-10-CM | POA: Diagnosis not present

## 2022-08-17 DIAGNOSIS — R252 Cramp and spasm: Secondary | ICD-10-CM | POA: Diagnosis not present

## 2022-08-17 DIAGNOSIS — Z6832 Body mass index (BMI) 32.0-32.9, adult: Secondary | ICD-10-CM | POA: Diagnosis not present

## 2022-08-17 DIAGNOSIS — D696 Thrombocytopenia, unspecified: Secondary | ICD-10-CM | POA: Diagnosis not present

## 2022-08-17 DIAGNOSIS — E782 Mixed hyperlipidemia: Secondary | ICD-10-CM | POA: Diagnosis not present

## 2022-08-19 DIAGNOSIS — N132 Hydronephrosis with renal and ureteral calculous obstruction: Secondary | ICD-10-CM | POA: Diagnosis not present

## 2022-08-19 DIAGNOSIS — N281 Cyst of kidney, acquired: Secondary | ICD-10-CM | POA: Diagnosis not present

## 2022-08-19 DIAGNOSIS — K8020 Calculus of gallbladder without cholecystitis without obstruction: Secondary | ICD-10-CM | POA: Diagnosis not present

## 2022-08-19 DIAGNOSIS — R3121 Asymptomatic microscopic hematuria: Secondary | ICD-10-CM | POA: Diagnosis not present

## 2022-10-11 DIAGNOSIS — N202 Calculus of kidney with calculus of ureter: Secondary | ICD-10-CM | POA: Diagnosis not present

## 2022-10-14 ENCOUNTER — Other Ambulatory Visit: Payer: Self-pay | Admitting: Urology

## 2022-10-20 NOTE — Progress Notes (Signed)
COVID Vaccine Completed:  Yes  Date of COVID positive in last 90 days:  PCP - Gus Height, PA Cardiologist -   Chest x-ray -  EKG -  Stress Test -  ECHO -  Cardiac Cath -  Pacemaker/ICD device last checked: Spinal Cord Stimulator:  Bowel Prep -   Sleep Study -  CPAP -   Fasting Blood Sugar -  Checks Blood Sugar _____ times a day  Last dose of GLP1 agonist-  N/A GLP1 instructions:  N/A   Last dose of SGLT-2 inhibitors-  N/A SGLT-2 instructions: N/A   Blood Thinner Instructions:  Time Aspirin Instructions: Last Dose:  Activity level:  Can go up a flight of stairs and perform activities of daily living without stopping and without symptoms of chest pain or shortness of breath.  Able to exercise without symptoms  Unable to go up a flight of stairs without symptoms of     Anesthesia review:   Patient denies shortness of breath, fever, cough and chest pain at PAT appointment  Patient verbalized understanding of instructions that were given to them at the PAT appointment. Patient was also instructed that they will need to review over the PAT instructions again at home before surgery.

## 2022-10-20 NOTE — Patient Instructions (Signed)
SURGICAL WAITING ROOM VISITATION Patients having surgery or a procedure may have no more than 2 support people in the waiting area - these visitors may rotate.    Children under the age of 13 must have an adult with them who is not the patient.  If the patient needs to stay at the hospital during part of their recovery, the visitor guidelines for inpatient rooms apply. Pre-op nurse will coordinate an appropriate time for 1 support person to accompany patient in pre-op.  This support person may not rotate.    Please refer to the [facility] website for the visitor guidelines for Inpatients (after your surgery is over and you are in a regular room).       Your procedure is scheduled on: 10-26-22   Report to [facility] Main Entrance    Report to admitting at 5:15 AM   Call this number if you have problems the morning of surgery [phone]   Do not eat food or drink liquids :After Midnight.           If you have questions, please contact your surgeon's office.   FOLLOW  ANY ADDITIONAL PRE OP INSTRUCTIONS YOU RECEIVED FROM YOUR SURGEON'S OFFICE!!!     Oral Hygiene is also important to reduce your risk of infection.                                    Remember - BRUSH YOUR TEETH THE MORNING OF SURGERY WITH YOUR REGULAR TOOTHPASTE   Do NOT smoke after Midnight   Take these medicines the morning of surgery with A SIP OF WATER:   Okay to use nasal spray  Stop all vitamins and herbal supplements 7 days before surgery  How to Manage Your Diabetes Before and After Surgery  Why is it important to control my blood sugar before and after surgery? Improving blood sugar levels before and after surgery helps healing and can limit problems. A way of improving blood sugar control is eating a healthy diet by:  Eating less sugar and carbohydrates  Increasing activity/exercise  Talking with your doctor about reaching your blood sugar goals High blood sugars (greater than 180 mg/dL)  can raise your risk of infections and slow your recovery, so you will need to focus on controlling your diabetes during the weeks before surgery. Make sure that the doctor who takes care of your diabetes knows about your planned surgery including the date and location.  How do I manage my blood sugar before surgery? Check your blood sugar at least 4 times a day, starting 2 days before surgery, to make sure that the level is not too high or low. Check your blood sugar the morning of your surgery when you wake up and every 2 hours until you get to the Short Stay unit. If your blood sugar is less than 70 mg/dL, you will need to treat for low blood sugar: Do not take insulin. Treat a low blood sugar (less than 70 mg/dL) with  cup of clear juice (cranberry or apple), 4 glucose tablets, OR glucose gel. Recheck blood sugar in 15 minutes after treatment (to make sure it is greater than 70 mg/dL). If your blood sugar is not greater than 70 mg/dL on recheck, call [phone] for further instructions. Report your blood sugar to the short stay nurse when you get to Short Stay.  If you are admitted to the hospital  after surgery: Your blood sugar will be checked by the staff and you will probably be given insulin after surgery (instead of oral diabetes medicines) to make sure you have good blood sugar levels. The goal for blood sugar control after surgery is 80-180 mg/dL.   WHAT DO I DO ABOUT MY DIABETES MEDICATION?  Do not take oral diabetes medicines (pills) the morning of surgery. (Do not take Metformin the morning of surgery)   DO NOT TAKE THE FOLLOWING 7 DAYS PRIOR TO SURGERY: Ozempic, Wegovy, Rybelsus (Semaglutide), Byetta (exenatide), Bydureon (exenatide ER), Victoza, Saxenda (liraglutide), or Trulicity (dulaglutide) Mounjaro (Tirzepatide) Adlyxin (Lixisenatide), Polyethylene Glycol Loxenatide.   Reviewed and Endorsed by [facility] Patient Education Committee, August 2015  Bring CPAP mask and  tubing day of surgery.                              You may not have any metal on your body including  jewelry, and body piercing             Do not wear  lotions, powders, cologne, or deodorant              Men may shave face and neck.   Do not bring valuables to the hospital. [facility] IS NOT RESPONSIBLE   FOR VALUABLES.   Contacts, dentures or bridgework may not be worn into surgery.  DO NOT BRING YOUR HOME MEDICATIONS TO THE HOSPITAL. PHARMACY WILL DISPENSE MEDICATIONS LISTED ON YOUR MEDICATION LIST TO YOU DURING YOUR ADMISSION IN THE HOSPITAL!    Patients discharged on the day of surgery will not be allowed to drive home.  Someone NEEDS to stay with you for the first 24 hours after anesthesia.   Special Instructions: Bring a copy of your healthcare power of attorney and living will documents the day of surgery if you haven't scanned them before.              Please read over the following fact sheets you were given: IF YOU HAVE QUESTIONS ABOUT YOUR PRE-OP INSTRUCTIONS PLEASE CALL [phone] [facility]  If you received a COVID test during your pre-op visit  it is requested that you wear a mask when out in public, stay away from anyone that may not be feeling well and notify your surgeon if you develop symptoms. If you test positive for Covid or have been in contact with anyone that has tested positive in the last 10 days please notify you surgeon.   - Preparing for Surgery Before surgery, you can play an important role.  Because skin is not sterile, your skin needs to be as free of germs as possible.  You can reduce the number of germs on your skin by washing with CHG (chlorahexidine gluconate) soap before surgery.  CHG is an antiseptic cleaner which kills germs and bonds with the skin to continue killing germs even after washing. Please DO NOT use if you have an allergy to CHG or antibacterial soaps.  If your skin becomes reddened/irritated stop using the CHG and inform your  nurse when you arrive at Short Stay. Do not shave (including legs and underarms) for at least 48 hours prior to the first CHG shower.  You may shave your face/neck.  Please follow these instructions carefully:  1.  Shower with CHG Soap the night before surgery and the  morning of surgery.  2.  If you choose to wash your hair, wash  your hair first as usual with your normal  shampoo.  3.  After you shampoo, rinse your hair and body thoroughly to remove the shampoo.                             4.  Use CHG as you would any other liquid soap.  You can apply chg directly to the skin and wash.  Gently with a scrungie or clean washcloth.  5.  Apply the CHG Soap to your body ONLY FROM THE NECK DOWN.   Do   not use on face/ open                           Wound or open sores. Avoid contact with eyes, ears mouth and   genitals (private parts).                       Wash face,  Genitals (private parts) with your normal soap.             6.  Wash thoroughly, paying special attention to the area where your    surgery  will be performed.  7.  Thoroughly rinse your body with warm water from the neck down.  8.  DO NOT shower/wash with your normal soap after using and rinsing off the CHG Soap.                9.  Pat yourself dry with a clean towel.            10.  Wear clean pajamas.            11.  Place clean sheets on your bed the night of your first shower and do not  sleep with pets. Day of Surgery : Do not apply any lotions/deodorants the morning of surgery.  Please wear clean clothes to the hospital/surgery center.  FAILURE TO FOLLOW THESE INSTRUCTIONS MAY RESULT IN THE CANCELLATION OF YOUR SURGERY  PATIENT SIGNATURE_________________________________  NURSE SIGNATURE__________________________________  ________________________________________________________________________

## 2022-10-21 ENCOUNTER — Encounter (HOSPITAL_COMMUNITY): Payer: Self-pay

## 2022-10-21 ENCOUNTER — Encounter (HOSPITAL_COMMUNITY)
Admission: RE | Admit: 2022-10-21 | Discharge: 2022-10-21 | Disposition: A | Discharge status: Home / Self Care | Payer: Medicare Other | Source: Ambulatory Visit | Attending: Urology | Admitting: Urology

## 2022-10-21 ENCOUNTER — Other Ambulatory Visit: Payer: Self-pay

## 2022-10-21 VITALS — BP 154/84 | HR 81 | Temp 98.2°F | Resp 16 | Ht 68.0 in | Wt 218.0 lb

## 2022-10-21 DIAGNOSIS — E119 Type 2 diabetes mellitus without complications: Secondary | ICD-10-CM | POA: Diagnosis not present

## 2022-10-21 DIAGNOSIS — Z01818 Encounter for other preprocedural examination: Secondary | ICD-10-CM | POA: Diagnosis not present

## 2022-10-21 DIAGNOSIS — I10 Essential (primary) hypertension: Secondary | ICD-10-CM | POA: Insufficient documentation

## 2022-10-21 LAB — GLUCOSE, CAPILLARY: Glucose-Capillary: 105 mg/dL — ABNORMAL HIGH (ref 70–99)

## 2022-10-21 LAB — BASIC METABOLIC PANEL
Anion gap: 8 (ref 5–15)
BUN: 18 mg/dL (ref 8–23)
CO2: 26 mmol/L (ref 22–32)
Calcium: 9.5 mg/dL (ref 8.9–10.3)
Chloride: 106 mmol/L (ref 98–111)
Creatinine, Ser: 1.1 mg/dL (ref 0.61–1.24)
GFR, Estimated: >60 mL/min (ref >60)
Glucose, Bld: 106 mg/dL — ABNORMAL HIGH (ref 70–99)
Potassium: 4.6 mmol/L (ref 3.5–5.1)
Sodium: 140 mmol/L (ref 135–145)

## 2022-10-21 LAB — HEMOGLOBIN A1C
Hgb A1c MFr Bld: 6.8 % — ABNORMAL HIGH (ref 4.8–5.6)
Mean Plasma Glucose: 148.46 mg/dL

## 2022-10-21 LAB — SURGICAL PCR SCREEN
MRSA, PCR: NEGATIVE
Staphylococcus aureus: NEGATIVE

## 2022-10-24 NOTE — H&P (Signed)
Office Visit Report     10/11/2022   --------------------------------------------------------------------------------   Adam Mata  MRN: [id number]  DOB: [birth year], 82 year old Male  SSN:    PRIMARY CARE:  Mariana Arn, NP  PRIMARY CARE FAX:  [phone]  REFERRING:  Doyne Keel, MD  PROVIDER:  [name], M.D.  TREATING:  Bartholomew Crews, NP  LOCATION:  [facility] [phone]     --------------------------------------------------------------------------------   CC/HPI: F/u -   1) kidney stones - [place]. CT scan - 8 mm left distal stone, a 7 mm right lower pole stone and 8 mm left upper pole stone AND a stone in the right renal pelvis and not the ureter. F/u KUB here with left stent and multiple bilateral stones - largest 7 mm RUP, 7 mm LLP. We removed stent as it was causing him too much distress.   Renal US 02/22, with dilation of RIGHT renal pelvis and proximal ureter. No left hydro. LLP cyst. Bilateral stones. Dilation similar to 2016 [place] and CT, but he was passing a stone on the right. He had right flank pain. KUB 02/22, with stable bilateral renal stones. Possible small distal right stone(s). CT 03/22 confirmed right distal stones and right hydro. He was taken for right URS/HLL/stent 04/22. He pulled the stent. [place] 07/22 with resolution of hydro. Atrophy of right and enlargement of left kidney.   He has a history of chronic kidney disease with baseline creatinine 1.3-1.4, gfr 60. His creatinine had bumped to 3.4 with the acute stone episode, but cr 1.14 in Dec 2022 PSA was 0.5 10/31/2019. KUB in 2023 with Scattered bilateral stones largest about 6-7 mm on both sides. He has a history of kidney stones in 1978 and 2018 with stents and URS. He saw Dr. Salvatore Decent.   Dec 2023 renal US with bilateral stone and cyst but no hydro. Rt extrarenal pelvis (stable dilation). PVR 37 ml. KUB  with one right and six left stones largest 6 mm. Pelvic phlebolith. No flank pain or stone passage.   2) BPH - since 03/22 he took tamsulosin for kidney stones and noticed a much better urine flow. PVR 61. His PSA was 0.5 09/21. AUASS = 2. His Dec 2023 PSA was 2.2.   Today, he is seen for the above. KUB with 1 right and 6 left stones - stable. He hasn't passed a stone but had some penile or pelvis pain. UA 3-10.   10/11/2022: 82 year old man who presents today for follow-up of a distal right ureteral stone. He has not seen a stone pass and does continue to experience intermittent discomfort. He had a CT scan completed 2 months ago which did show multiple bilateral stones and the distal ureteral right-sided stone. His imaging today is concerning for obstruction. He denies fevers and chills. His pain is well-controlled     ALLERGIES: None   MEDICATIONS: Tamsulosin Hcl 0.4 mg capsule  Amlodipine Besylate 5 mg tablet  Flonase Allergy Relief  Metformin Hcl Er 750 mg tablet, extended release 24 hr  Potassium Citrate Er 5 meq (540 mg) tablet, extended release  Valsartan 80 mg tablet 1 tablet PO Daily     GU PSH: Cysto Remove Stent FB Sim - 2021 Cystoscopy Insert Stent       PSH Notes: Kidney stone surgery x3   NON-GU PSH: Visit Complexity (formerly GPC1X) - 08/09/2022     GU  PMH: Microscopic hematuria - 08/19/2022, Check CT scan. , - 08/09/2022 BPH w/LUTS, he noted his PSA was 0.5 but rose to 2.2. It was repeated. - 08/09/2022, - 02/10/2022, - 05/01/2021, Discussed the nature r/b/a to tamsulosin and he will continue , - 2022 Renal calculus, CT scan - 08/09/2022, continue K citrate. F/u KUB in 6 mo, - 02/10/2022, - 05/01/2021, Likely main stone burden is left side. Disc nature r/b of urs, eswl and surv. Check KUB in 6 mo , - 2022, - 2022, - 2022, - 2022, Stones appear stable on KUB , - 2022, See back in 6 weeks with renal US. 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 , - 2022 Ureteral calculus - 2022, - 2022 Other mechanical complication indwelling ureteral stent, initial enc, Stent removed today. 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   Tobacco Use Assessment Completed: Used Tobacco in last 30 days? Does not use smokeless tobacco. Has never drank.  Drinks 1 caffeinated drink per day. Patient's occupation is/was retired.    REVIEW OF SYSTEMS:    GU Review Male:   Patient reports frequent urination and get up at night to urinate. Patient denies hard to postpone urination, burning/ pain with urination, leakage of urine, stream starts and stops, trouble starting your stream, have to strain to urinate , erection problems, and penile pain.  Gastrointestinal (Upper):   Patient denies nausea, vomiting, and indigestion/ heartburn.  Gastrointestinal (Lower):   Patient denies diarrhea and constipation.  Constitutional:   Patient denies fever, night sweats, weight loss, and fatigue.  Skin:   Patient denies skin rash/ lesion and itching.  Eyes:   Patient denies blurred vision and double vision.  Hematologic/Lymphatic:   Patient denies swollen glands and easy bruising.  Musculoskeletal:   Patient denies back pain and joint pain.  Neurological:   Patient denies headaches and dizziness.  Psychologic:   Patient denies depression and anxiety.   VITAL SIGNS:      10/11/2022 02:55 PM  BP 108/69  mmHg  Pulse 91 /min  Temperature 97.3 F / 36.2 C   MULTI-SYSTEM PHYSICAL EXAMINATION:    Constitutional: Obese. No physical deformities. Normally developed. Good grooming.   Respiratory: No labored breathing, no use of accessory muscles.   Cardiovascular: Normal temperature, normal extremity pulses, no swelling, no varicosities.  Skin: No paleness, no jaundice, no cyanosis. No lesion, no ulcer, no rash.  Neurologic / Psychiatric: Oriented to time, oriented to place, oriented to person. No depression, no anxiety, no agitation.  Gastrointestinal: No mass, no tenderness, no rigidity, non obese abdomen.     Complexity of Data:  Source Of History:  Patient  Records Review:   Previous Doctor Records, Previous Patient Records  Urine Test Review:   Urinalysis  X-Ray Review: KUB: Reviewed Films. Reviewed Report. Discussed With Patient.  Renal Ultrasound: Reviewed Films. Reviewed Report. Discussed With Patient.  C.T. Abdomen/Pelvis: Reviewed Films. Reviewed Report.  08/09/22  PSA  Total PSA 0.23 ng/mL    10/11/22  Urinalysis  Urine Appearance Clear   Urine Color Yellow   Urine Glucose Neg mg/dL  Urine Bilirubin Neg mg/dL  Urine Ketones Neg mg/dL  Urine Specific Gravity 1.025   Urine Blood Neg ery/uL  Urine pH <=5.0   Urine Protein Neg mg/dL  Urine Urobilinogen 0.2 mg/dL  Urine Nitrites Neg   Urine Leukocyte Esterase Neg leu/uL   PROCEDURES:         KUB - 02725  A single view of the abdomen is obtained. Bilateral renal shadows are visualized. There are stable pelvic phleboliths noted. Multiple opacities are noted within the left renal shadow. 6 mm opacity is noted within the right renal shadow.      . Patient confirmed No Neulasta OnPro Device.            Renal Ultrasound - 36644  Right Kidney: Length: 10.68 cm Depth: 7.06 cm Cortical Width: 1.29 cm Width: 6.94 cm  Left Kidney: Length: 13.35cm Depth: 7.09 cm Cortical Width: 1.5 cm Width: 6.94 cm  Left Kidney/Ureter:   multiple calcs, no hydro, large cyst off lower pole 5.7x5.7cm  Right Kidney/Ureter:  cyst off lower pole, multiple calcs noted, moderate to severe hydro , dilated ureter (the distal ureter could not be seen due to bowel gas)  Bladder:  pvr 16.79ml      . Patient confirmed No Neulasta OnPro Device.           Urinalysis - 81003 Dipstick Dipstick Cont'd  Color: Yellow Bilirubin: Neg  Appearance: Clear Ketones: Neg  Specific Gravity: 1.025 Blood: Neg  pH: <=5.0 Protein: Neg  Glucose: Neg Urobilinogen: 0.2    Nitrites: Neg    Leukocyte Esterase: Neg    Notes:      ASSESSMENT:      ICD-10 Details  1 GU:   Ureteral calculus - N20.1 Right, Acute, Uncomplicated  2   Ureteral obstruction secondary to calculous - N13.2 Right, Acute, Uncomplicated  3   Renal calculus - N20.0    PLAN:           Orders X-Rays: KUB          Schedule Return Visit/Planned Activity: Next Available Appointment - Schedule Surgery          Document Letter(s):  Created for Patient: Clinical Summary         Notes:   He has what appears to be continued obstruction to the right kidney as well as continued symptoms of discomfort. Options were discussed today and he would like to pursue bilateral ureteroscopy for goal of stone free.    Stone intervention was discussed in detail today. For ureteroscopy, the patient understands that there is a chance for a staged procedure. Patient also understands that there is risk for bleeding, infection, injury to surrounding organs, and general risks of anesthesia. The patient also understands the placement of a stent and the risks of stent placement including, risk for infection, the risk for pain, and the risk for injury. For ESWL, the patient understands that there is a chance of failure of procedure, there is also a risk for bruising, infection, bleeding, and injury to surrounding structures. The patient verbalized understanding to these risks.        Next Appointment:       Next Appointment: 12/01/2022 02:00 PM    Appointment Type: Office Visit Established Patient    Location: [facility] [phone]  Provider: [name], M.D.    Reason for Visit: 3 mnths fu      * Signed by Bartholomew Crews, NP on 10/13/22 at 1:46 PM (EDT*      The information contained in this medical record document is considered private and confidential patient information. This information can only be used for the medical diagnosis and/or medical services that are being provided by the patient's selected caregivers. This information can only be distributed outside of the patient's care if the patient agrees and signs waivers of authorization for this information to be sent to an outside source or route.

## 2022-10-24 NOTE — H&P (View-Only) (Signed)
Office Visit Report     10/11/2022   --------------------------------------------------------------------------------   Adam Mata. Hoos  MRN: [id number]  DOB: [birth year], 82 year old Male  SSN:    PRIMARY CARE:  Mariana Arn, NP  PRIMARY CARE FAX:  [phone]  REFERRING:  Doyne Keel, MD  PROVIDER:  [name], M.D.  TREATING:  Bartholomew Crews, NP  LOCATION:  [facility] [phone]     --------------------------------------------------------------------------------   CC/HPI: F/u -   1) kidney stones - [place]. CT scan - 8 mm left distal stone, a 7 mm right lower pole stone and 8 mm left upper pole stone AND a stone in the right renal pelvis and not the ureter. F/u KUB here with left stent and multiple bilateral stones - largest 7 mm RUP, 7 mm LLP. We removed stent as it was causing him too much distress.   Renal US 02/22, with dilation of RIGHT renal pelvis and proximal ureter. No left hydro. LLP cyst. Bilateral stones. Dilation similar to 2016 [place] and CT, but he was passing a stone on the right. He had right flank pain. KUB 02/22, with stable bilateral renal stones. Possible small distal right stone(s). CT 03/22 confirmed right distal stones and right hydro. He was taken for right URS/HLL/stent 04/22. He pulled the stent. [place] 07/22 with resolution of hydro. Atrophy of right and enlargement of left kidney.   He has a history of chronic kidney disease with baseline creatinine 1.3-1.4, gfr 60. His creatinine had bumped to 3.4 with the acute stone episode, but cr 1.14 in Dec 2022 PSA was 0.5 10/31/2019. KUB in 2023 with Scattered bilateral stones largest about 6-7 mm on both sides. He has a history of kidney stones in 1978 and 2018 with stents and URS. He saw Dr. Salvatore Decent.   Dec 2023 renal US with bilateral stone and cyst but no hydro. Rt extrarenal pelvis (stable dilation). PVR 37 ml. KUB  with one right and six left stones largest 6 mm. Pelvic phlebolith. No flank pain or stone passage.   2) BPH - since 03/22 he took tamsulosin for kidney stones and noticed a much better urine flow. PVR 61. His PSA was 0.5 09/21. AUASS = 2. His Dec 2023 PSA was 2.2.   Today, he is seen for the above. KUB with 1 right and 6 left stones - stable. He hasn't passed a stone but had some penile or pelvis pain. UA 3-10.   10/11/2022: 82 year old man who presents today for follow-up of a distal right ureteral stone. He has not seen a stone pass and does continue to experience intermittent discomfort. He had a CT scan completed 2 months ago which did show multiple bilateral stones and the distal ureteral right-sided stone. His imaging today is concerning for obstruction. He denies fevers and chills. His pain is well-controlled     ALLERGIES: None   MEDICATIONS: Tamsulosin Hcl 0.4 mg capsule  Amlodipine Besylate 5 mg tablet  Flonase Allergy Relief  Metformin Hcl Er 750 mg tablet, extended release 24 hr  Potassium Citrate Er 5 meq (540 mg) tablet, extended release  Valsartan 80 mg tablet 1 tablet PO Daily     GU PSH: Cysto Remove Stent FB Sim - 2021 Cystoscopy Insert Stent       PSH Notes: Kidney stone surgery x3   NON-GU PSH: Visit Complexity (formerly GPC1X) - 08/09/2022     GU  PMH: Microscopic hematuria - 08/19/2022, Check CT scan. , - 08/09/2022 BPH w/LUTS, he noted his PSA was 0.5 but rose to 2.2. It was repeated. - 08/09/2022, - 02/10/2022, - 05/01/2021, Discussed the nature r/b/a to tamsulosin and he will continue , - 2022 Renal calculus, CT scan - 08/09/2022, continue K citrate. F/u KUB in 6 mo, - 02/10/2022, - 05/01/2021, Likely main stone burden is left side. Disc nature r/b of urs, eswl and surv. Check KUB in 6 mo , - 2022, - 2022, - 2022, - 2022, Stones appear stable on KUB , - 2022, See back in 6 weeks with renal US. 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 , - 2022 Ureteral calculus - 2022, - 2022 Other mechanical complication indwelling ureteral stent, initial enc, Stent removed today. 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   Tobacco Use Assessment Completed: Used Tobacco in last 30 days? Does not use smokeless tobacco. Has never drank.  Drinks 1 caffeinated drink per day. Patient's occupation is/was retired.    REVIEW OF SYSTEMS:    GU Review Male:   Patient reports frequent urination and get up at night to urinate. Patient denies hard to postpone urination, burning/ pain with urination, leakage of urine, stream starts and stops, trouble starting your stream, have to strain to urinate , erection problems, and penile pain.  Gastrointestinal (Upper):   Patient denies nausea, vomiting, and indigestion/ heartburn.  Gastrointestinal (Lower):   Patient denies diarrhea and constipation.  Constitutional:   Patient denies fever, night sweats, weight loss, and fatigue.  Skin:   Patient denies skin rash/ lesion and itching.  Eyes:   Patient denies blurred vision and double vision.  Hematologic/Lymphatic:   Patient denies swollen glands and easy bruising.  Musculoskeletal:   Patient denies back pain and joint pain.  Neurological:   Patient denies headaches and dizziness.  Psychologic:   Patient denies depression and anxiety.   VITAL SIGNS:      10/11/2022 02:55 PM  BP 108/69  mmHg  Pulse 91 /min  Temperature 97.3 F / 36.2 C   MULTI-SYSTEM PHYSICAL EXAMINATION:    Constitutional: Obese. No physical deformities. Normally developed. Good grooming.   Respiratory: No labored breathing, no use of accessory muscles.   Cardiovascular: Normal temperature, normal extremity pulses, no swelling, no varicosities.  Skin: No paleness, no jaundice, no cyanosis. No lesion, no ulcer, no rash.  Neurologic / Psychiatric: Oriented to time, oriented to place, oriented to person. No depression, no anxiety, no agitation.  Gastrointestinal: No mass, no tenderness, no rigidity, non obese abdomen.     Complexity of Data:  Source Of History:  Patient  Records Review:   Previous Doctor Records, Previous Patient Records  Urine Test Review:   Urinalysis  X-Ray Review: KUB: Reviewed Films. Reviewed Report. Discussed With Patient.  Renal Ultrasound: Reviewed Films. Reviewed Report. Discussed With Patient.  C.T. Abdomen/Pelvis: Reviewed Films. Reviewed Report.  08/09/22  PSA  Total PSA 0.23 ng/mL    10/11/22  Urinalysis  Urine Appearance Clear   Urine Color Yellow   Urine Glucose Neg mg/dL  Urine Bilirubin Neg mg/dL  Urine Ketones Neg mg/dL  Urine Specific Gravity 1.025   Urine Blood Neg ery/uL  Urine pH <=5.0   Urine Protein Neg mg/dL  Urine Urobilinogen 0.2 mg/dL  Urine Nitrites Neg   Urine Leukocyte Esterase Neg leu/uL   PROCEDURES:         KUB - 02725  A single view of the abdomen is obtained. Bilateral renal shadows are visualized. There are stable pelvic phleboliths noted. Multiple opacities are noted within the left renal shadow. 6 mm opacity is noted within the right renal shadow.      . Patient confirmed No Neulasta OnPro Device.            Renal Ultrasound - 36644  Right Kidney: Length: 10.68 cm Depth: 7.06 cm Cortical Width: 1.29 cm Width: 6.94 cm  Left Kidney: Length: 13.35cm Depth: 7.09 cm Cortical Width: 1.5 cm Width: 6.94 cm  Left Kidney/Ureter:   multiple calcs, no hydro, large cyst off lower pole 5.7x5.7cm  Right Kidney/Ureter:  cyst off lower pole, multiple calcs noted, moderate to severe hydro , dilated ureter (the distal ureter could not be seen due to bowel gas)  Bladder:  pvr 16.79ml      . Patient confirmed No Neulasta OnPro Device.           Urinalysis - 81003 Dipstick Dipstick Cont'd  Color: Yellow Bilirubin: Neg  Appearance: Clear Ketones: Neg  Specific Gravity: 1.025 Blood: Neg  pH: <=5.0 Protein: Neg  Glucose: Neg Urobilinogen: 0.2    Nitrites: Neg    Leukocyte Esterase: Neg    Notes:      ASSESSMENT:      ICD-10 Details  1 GU:   Ureteral calculus - N20.1 Right, Acute, Uncomplicated  2   Ureteral obstruction secondary to calculous - N13.2 Right, Acute, Uncomplicated  3   Renal calculus - N20.0    PLAN:           Orders X-Rays: KUB          Schedule Return Visit/Planned Activity: Next Available Appointment - Schedule Surgery          Document Letter(s):  Created for Patient: Clinical Summary         Notes:   He has what appears to be continued obstruction to the right kidney as well as continued symptoms of discomfort. Options were discussed today and he would like to pursue bilateral ureteroscopy for goal of stone free.    Stone intervention was discussed in detail today. For ureteroscopy, the patient understands that there is a chance for a staged procedure. Patient also understands that there is risk for bleeding, infection, injury to surrounding organs, and general risks of anesthesia. The patient also understands the placement of a stent and the risks of stent placement including, risk for infection, the risk for pain, and the risk for injury. For ESWL, the patient understands that there is a chance of failure of procedure, there is also a risk for bruising, infection, bleeding, and injury to surrounding structures. The patient verbalized understanding to these risks.        Next Appointment:       Next Appointment: 12/01/2022 02:00 PM    Appointment Type: Office Visit Established Patient    Location: [facility] [phone]  Provider: [name], M.D.    Reason for Visit: 3 mnths fu      * Signed by Bartholomew Crews, NP on 10/13/22 at 1:46 PM (EDT*      The information contained in this medical record document is considered private and confidential patient information. This information can only be used for the medical diagnosis and/or medical services that are being provided by the patient's selected caregivers. This information can only be distributed outside of the patient's care if the patient agrees and signs waivers of authorization for this information to be sent to an outside source or route.

## 2022-10-26 ENCOUNTER — Other Ambulatory Visit: Payer: Self-pay

## 2022-10-26 ENCOUNTER — Ambulatory Visit (HOSPITAL_BASED_OUTPATIENT_CLINIC_OR_DEPARTMENT_OTHER): Payer: Medicare Other | Admitting: Certified Registered"

## 2022-10-26 ENCOUNTER — Encounter (HOSPITAL_COMMUNITY): Payer: Self-pay | Admitting: Urology

## 2022-10-26 ENCOUNTER — Encounter (HOSPITAL_COMMUNITY)
Admission: RE | Disposition: A | Discharge status: Home / Self Care | Payer: Self-pay | Source: Home / Self Care | Attending: Urology

## 2022-10-26 ENCOUNTER — Ambulatory Visit (HOSPITAL_COMMUNITY)
Admission: RE | Admit: 2022-10-26 | Discharge: 2022-10-26 | Disposition: A | Discharge status: Home / Self Care | Payer: Medicare Other | Attending: Urology | Admitting: Urology

## 2022-10-26 ENCOUNTER — Ambulatory Visit (HOSPITAL_COMMUNITY): Payer: Medicare Other

## 2022-10-26 ENCOUNTER — Ambulatory Visit (HOSPITAL_COMMUNITY): Payer: Medicare Other | Admitting: Certified Registered"

## 2022-10-26 DIAGNOSIS — N132 Hydronephrosis with renal and ureteral calculous obstruction: Secondary | ICD-10-CM

## 2022-10-26 DIAGNOSIS — N183 Chronic kidney disease, stage 3 unspecified: Secondary | ICD-10-CM | POA: Diagnosis not present

## 2022-10-26 DIAGNOSIS — Z79899 Other long term (current) drug therapy: Secondary | ICD-10-CM | POA: Diagnosis not present

## 2022-10-26 DIAGNOSIS — Z7984 Long term (current) use of oral hypoglycemic drugs: Secondary | ICD-10-CM | POA: Diagnosis not present

## 2022-10-26 DIAGNOSIS — N202 Calculus of kidney with calculus of ureter: Secondary | ICD-10-CM | POA: Diagnosis not present

## 2022-10-26 DIAGNOSIS — N400 Enlarged prostate without lower urinary tract symptoms: Secondary | ICD-10-CM | POA: Insufficient documentation

## 2022-10-26 DIAGNOSIS — Z6833 Body mass index (BMI) 33.0-33.9, adult: Secondary | ICD-10-CM | POA: Insufficient documentation

## 2022-10-26 DIAGNOSIS — I129 Hypertensive chronic kidney disease with stage 1 through stage 4 chronic kidney disease, or unspecified chronic kidney disease: Secondary | ICD-10-CM | POA: Diagnosis not present

## 2022-10-26 DIAGNOSIS — E669 Obesity, unspecified: Secondary | ICD-10-CM | POA: Insufficient documentation

## 2022-10-26 DIAGNOSIS — E119 Type 2 diabetes mellitus without complications: Secondary | ICD-10-CM

## 2022-10-26 DIAGNOSIS — I10 Essential (primary) hypertension: Secondary | ICD-10-CM

## 2022-10-26 DIAGNOSIS — E1122 Type 2 diabetes mellitus with diabetic chronic kidney disease: Secondary | ICD-10-CM | POA: Diagnosis not present

## 2022-10-26 DIAGNOSIS — N200 Calculus of kidney: Secondary | ICD-10-CM

## 2022-10-26 HISTORY — PX: CYSTOSCOPY/URETEROSCOPY/HOLMIUM LASER/STENT PLACEMENT: SHX6546

## 2022-10-26 LAB — GLUCOSE, CAPILLARY
Glucose-Capillary: 126 mg/dL — ABNORMAL HIGH (ref 70–99)
Glucose-Capillary: 158 mg/dL — ABNORMAL HIGH (ref 70–99)

## 2022-10-26 SURGERY — CYSTOSCOPY/URETEROSCOPY/HOLMIUM LASER/STENT PLACEMENT
Anesthesia: General | Site: Ureter | Laterality: Bilateral

## 2022-10-26 MED ORDER — INSULIN ASPART 100 UNIT/ML IJ SOLN: 0.0000 [IU] | INTRAMUSCULAR | Status: DC | PRN

## 2022-10-26 MED ORDER — 0.9 % SODIUM CHLORIDE (POUR BTL) OPTIME
TOPICAL | Status: DC | PRN
  Administered 2022-10-26: 1000 mL

## 2022-10-26 MED ORDER — CEFAZOLIN SODIUM-DEXTROSE 2-4 GM/100ML-% IV SOLN
2.0000 g | INTRAVENOUS | Status: AC
  Administered 2022-10-26: 2 g via INTRAVENOUS
  Filled 2022-10-26: qty 100

## 2022-10-26 MED ORDER — LIDOCAINE HCL (CARDIAC) PF 100 MG/5ML IV SOSY
PREFILLED_SYRINGE | INTRAVENOUS | Status: DC | PRN
Start: 2022-10-26 — End: 2022-10-26
  Administered 2022-10-26: 100 mg via INTRATRACHEAL
  Administered 2022-10-26: 30 mg via INTRATRACHEAL

## 2022-10-26 MED ORDER — PROPOFOL 10 MG/ML IV BOLUS
INTRAVENOUS | Status: DC | PRN
Start: 2022-10-26 — End: 2022-10-26
  Administered 2022-10-26: 30 mg via INTRAVENOUS
  Administered 2022-10-26: 200 mg via INTRAVENOUS

## 2022-10-26 MED ORDER — IOHEXOL 300 MG/ML  SOLN
INTRAMUSCULAR | Status: DC | PRN
  Administered 2022-10-26: 18 mL

## 2022-10-26 MED ORDER — SODIUM CHLORIDE 0.9 % IR SOLN
Status: DC | PRN
  Administered 2022-10-26: 6000 mL

## 2022-10-26 MED ORDER — DEXAMETHASONE SODIUM PHOSPHATE 10 MG/ML IJ SOLN
INTRAMUSCULAR | Status: DC | PRN
  Administered 2022-10-26: 10 mg via INTRAVENOUS

## 2022-10-26 MED ORDER — FENTANYL CITRATE (PF) 100 MCG/2ML IJ SOLN
INTRAMUSCULAR | Status: AC
  Filled 2022-10-26: qty 2

## 2022-10-26 MED ORDER — ONDANSETRON HCL 4 MG/2ML IJ SOLN
INTRAMUSCULAR | Status: DC | PRN
Start: 2022-10-26 — End: 2022-10-26
  Administered 2022-10-26: 4 mg via INTRAVENOUS

## 2022-10-26 MED ORDER — LACTATED RINGERS IV SOLN: INTRAVENOUS | Status: DC

## 2022-10-26 MED ORDER — ORAL CARE MOUTH RINSE: 15.0000 mL | Freq: Once | OROMUCOSAL | Status: AC

## 2022-10-26 MED ORDER — CHLORHEXIDINE GLUCONATE 0.12 % MT SOLN
15.0000 mL | Freq: Once | OROMUCOSAL | Status: AC
  Administered 2022-10-26: 15 mL via OROMUCOSAL

## 2022-10-26 MED ORDER — FENTANYL CITRATE (PF) 100 MCG/2ML IJ SOLN
INTRAMUSCULAR | Status: DC | PRN
  Administered 2022-10-26: 100 ug via INTRAVENOUS
  Administered 2022-10-26: 25 ug via INTRAVENOUS

## 2022-10-26 SURGICAL SUPPLY — 23 items
BAG URO CATCHER STRL LF (MISCELLANEOUS) ×1 IMPLANT
BASKET LASER NITINOL 1.9FR (BASKET) IMPLANT
BASKET ZERO TIP NITINOL 2.4FR (BASKET) IMPLANT
BSKT STON RTRVL 120 1.9FR (BASKET) ×1
BSKT STON RTRVL ZERO TP 2.4FR (BASKET)
CATH URET 5FR 70CM CONE TIP (BALLOONS) IMPLANT
CATH URETL OPEN END 6FR 70 (CATHETERS) ×1 IMPLANT
CLOTH BEACON ORANGE TIMEOUT ST (SAFETY) ×1 IMPLANT
FIBER LASER MOSES 200 DFL (Laser) IMPLANT
FIBER LASER MOSES 365 DFL (Laser) IMPLANT
GLOVE BIO SURGEON STRL SZ7.5 (GLOVE) ×1 IMPLANT
GOWN STRL REUS W/ TWL XL LVL3 (GOWN DISPOSABLE) ×1 IMPLANT
GOWN STRL REUS W/TWL XL LVL3 (GOWN DISPOSABLE) ×1
GUIDEWIRE STR DUAL SENSOR (WIRE) ×1 IMPLANT
GUIDEWIRE ZIPWRE .038 STRAIGHT (WIRE) IMPLANT
KIT TURNOVER KIT A (KITS) IMPLANT
MANIFOLD NEPTUNE II (INSTRUMENTS) ×1 IMPLANT
PACK CYSTO (CUSTOM PROCEDURE TRAY) ×1 IMPLANT
SHEATH NAVIGATOR HD 11/13X28 (SHEATH) IMPLANT
SHEATH NAVIGATOR HD 11/13X36 (SHEATH) IMPLANT
STENT URET 6FRX26 CONTOUR (STENTS) IMPLANT
TUBING CONNECTING 10 (TUBING) ×1 IMPLANT
TUBING UROLOGY SET (TUBING) ×1 IMPLANT

## 2022-10-26 NOTE — Op Note (Signed)
Preoperative diagnosis: Right ureteral stone, bilateral renal stones Postoperative diagnosis: Same  Procedure: Cystoscopy with bilateral retrograde pyelogram, bilateral ureteroscopy laser lithotripsy and stent placement  Surgeon: Mena Goes  Anesthesia: General  Indication for procedure: Adam Mata is a 82 year old male with symptomatic right ureteral stone that failed to pass.  He also had bilateral stones and wished to be stone free.  Findings: On exam the penis was circumcised without mass or lesion.  The glans and meatus appeared normal.  On DRE prostate was about 30 g and smooth without hard area or nodule.  Cystoscopy revealed a normal urethra, prostatic urethra with no obstruction.  Ureters were a bit laterally displaced with Hutch diverticula bilaterally.  Right retrograde pyelogram-this outlined a narrowing at the UVJ and proximal moderate hydroureteronephrosis of the ureter and collecting system all the way up.  Left retrograde pyelogram-this outlined a single ureter single collecting system unit with multiple filling defects in the calyces consistent with the stones but no filling defects in the renal pelvis or ureter.  No hydronephrosis.  Right ureteroscopy-a 4 mm stone was located at the right UVJ with proximal dilation.  A small midpole stone was noted.  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  Right lower pole stone-difficult to locate and access with the ureteroscope.  It remains and may need shockwave in the future.  Left ureteroscopy-multiple stones were noted.  5 stones were treated including the 2 large stones.  There was still 3 stones to locate and dust.  Access sheath only went up to about the iliacs so access and flexibility of the ureteroscope was limited.  Description of procedure: After consent was obtained patient brought to the operating room.  After adequate anesthesia he is placed  lithotomy position and prepped and draped in usual sterile fashion.  Timeout was performed to confirm the patient and procedure.  Cystoscope was passed per urethra and the bladder inspected.  The right ureteral orifice was cannulated with a 5 [place] open-ended catheter and retrograde injection of contrast was performed.  A sensor wire was advanced in the right collecting system.  A semirigid ureteroscope was advanced along the wire where the stone was located in the right distal ureter.  It was ensnared with an escape basket and dropped in the bladder.  No other stones noted in the right distal ureter and about the mid ureter I passed a zip wire to get 2 wires in place.  The scope was backed out.  I passed a medium access sheath without difficulty and the digital ureteroscope up to the kidney the kidney was a bit malrotated.  I could not access the right lower pole stone and anterior calyx.  The access sheath was backed out on the ureteroscope and the collecting system ureter carefully inspected on the way out noted to be free of injury.  The cystoscope was repassed and the left ureteral orifice cannulated with a 5 [place] open-ended catheter and retrograde injection of contrast was performed.  I then passed a sensor wire and passed the ureteral access sheath which would not quite pass out of the distal ureter.  I passed the semirigid scope and inspected well up into the proximal ureter without any difficulty and noted there to be no obstruction or injury of the ureter.  A second wire was passed under direct vision.  I then passed the 11/13 access sheath again but could not quite get out of  the distal ureter and just into the mid ureter under the iliacs.  Certainly did not want to force the access sheath so I left it here.  The digital scope was then advanced up to the kidney.  Going from upper to lower pole 5 stones were noted and dusted.  The 2 larger stones were located and dusted.  There was a lot of fragments  and stone dust and still 3 stones to be done him with limited access I was having trouble getting in the all the calyces.  Therefore decided to leave a stent and do a staged procedure on the side.  The access sheath was backed out on the ureteroscope and the collecting system and ureter inspected on the way out noted to be free of injury.  The wire was backloaded on the cystoscope and a 6 x 26 cm stent was advanced.  The wire was removed with a good coil seen in the kidney and a good coil in the bladder.  The right wire was backloaded on the cystoscope and a 6 x 26 cm stent was advanced.  Wire was removed with a good coil seen in the kidney and the bladder.  I left a string on the right stent.  He was awakened taken the cover room in stable condition.  Complications: None  Blood loss: Minimal  Specimens to office lab: Stone fragments  Drains: Right 6 x 26 cm stent with string, left 6 x 26 cm stent-no string.    Disposition: Patient stable to PACU. Will plan to come back for staged procedure on the left.

## 2022-10-26 NOTE — Interval H&P Note (Signed)
History and Physical Interval Note:  10/26/2022 7:23 AM  Adam Mata  has presented today for surgery, with the diagnosis of BILATERAL RENAL STONE RIGHT URETERAL OBSTRUCTION.  The various methods of treatment have been discussed with the patient and family. After consideration of risks, benefits and other options for treatment, the patient has consented to  Procedure(s) with comments: CYSTOSCOPY BILATERAL RETROGRADE PYELOGRAM BILATERAL URETEROSCOPY/HOLMIUM LASER/STENT PLACEMENT (Bilateral) - 90 MINS as a surgical intervention.  The patient's history has been reviewed, patient examined, no change in status, stable for surgery.  I have reviewed the patient's chart and labs.  Questions were answered to the patient's satisfaction.  He passed the small stone. Disc cont surveillance vs starting URS/HLL today. He wants to proceed with surgery and understands he may need a staged procedure. No cough or cold or congestion, No dysuria or gross hematuria or fever.    [name]

## 2022-10-26 NOTE — Discharge Instructions (Signed)
Removal of the stent: Remove the stent on Friday morning 10/29/2022 by pulling the string as instructed

## 2022-10-26 NOTE — Transfer of Care (Signed)
Immediate Anesthesia Transfer of Care Note  Patient: Adam Mata  Procedure(s) Performed: CYSTOSCOPY BILATERAL RETROGRADE PYELOGRAM BILATERAL URETEROSCOPY/HOLMIUM LASER/STENT PLACEMENT (Bilateral: Ureter)  Patient Location: PACU  Anesthesia Type:General  Level of Consciousness: awake, alert , oriented, and patient cooperative  Airway & Oxygen Therapy: Patient connected to face mask oxygen  Post-op Assessment: Report given to RN and Post -op Vital signs reviewed and stable  Post vital signs: stable  Last Vitals:  Vitals Value Taken Time  BP 188/92 10/26/22 0912  Temp    Pulse 83 10/26/22 0914  Resp 10 10/26/22 0914  SpO2 98 % 10/26/22 0914  Vitals shown include unfiled device data.  Last Pain:  Vitals:   10/26/22 0547  TempSrc: Oral  PainSc: 0-No pain      Patients Stated Pain Goal: 7 (10/26/22 0547)  Complications: No notable events documented.

## 2022-10-26 NOTE — Anesthesia Postprocedure Evaluation (Signed)
Anesthesia Post Note  Patient: Adam Mata  Procedure(s) Performed: CYSTOSCOPY BILATERAL RETROGRADE PYELOGRAM BILATERAL URETEROSCOPY/HOLMIUM LASER/STENT PLACEMENT (Bilateral: Ureter)     Patient location during evaluation: PACU Anesthesia Type: General Level of consciousness: awake Pain management: pain level controlled Vital Signs Assessment: post-procedure vital signs reviewed and stable Respiratory status: spontaneous breathing Cardiovascular status: stable Postop Assessment: no apparent nausea or vomiting Anesthetic complications: no  No notable events documented.  Last Vitals:  Vitals:   10/26/22 0930 10/26/22 0945  BP: 133/71 (!) 133/92  Pulse: 79 76  Resp: 16 16  Temp:  36.5 C  SpO2: 94% 93%    Last Pain:  Vitals:   10/26/22 0945  TempSrc:   PainSc: 0-No pain                 Caren Macadam

## 2022-10-26 NOTE — Anesthesia Preprocedure Evaluation (Signed)
Anesthesia Evaluation  Patient identified by MRN, date of birth, ID band Patient awake    Reviewed: Allergy & Precautions, NPO status , Patient's Chart, lab work & pertinent test results  Airway Mallampati: III  TM Distance: >3 FB Neck ROM: Full    Dental no notable dental hx. (+) Teeth Intact, Dental Advisory Given   Pulmonary neg pulmonary ROS Had sleep study about 10 years ago, negative Denies snoring at night   Pulmonary exam normal breath sounds clear to auscultation       Cardiovascular hypertension, Pt. on medications Normal cardiovascular exam Rhythm:Regular Rate:Normal     Neuro/Psych  negative psych ROS   GI/Hepatic negative GI ROS, Neg liver ROS,,,  Endo/Other  diabetes, Well Controlled, Type 2, Oral Hypoglycemic Agents  Obesity BMI 32  Renal/GU Renal disease (right ureteral calculi)CKD 3, Cr 1.2  negative genitourinary   Musculoskeletal  (+) Arthritis , Osteoarthritis,    Abdominal  (+) + obese  Peds negative pediatric ROS (+)  Hematology negative hematology ROS (+)   Anesthesia Other Findings   Reproductive/Obstetrics negative OB ROS                             Anesthesia Physical Anesthesia Plan  ASA: II  Anesthesia Plan: General   Post-op Pain Management:    Induction: Intravenous  PONV Risk Score and Plan: Ondansetron, Dexamethasone, Midazolam and Treatment may vary due to age or medical condition  Airway Management Planned: LMA  Additional Equipment: None  Intra-op Plan:   Post-operative Plan: Extubation in OR  Informed Consent: I have reviewed the patients History and Physical, chart, labs and discussed the procedure including the risks, benefits and alternatives for the proposed anesthesia with the patient or authorized representative who has indicated his/her understanding and acceptance.     Dental advisory given  Plan Discussed with:  CRNA  Anesthesia Plan Comments:         Anesthesia Quick Evaluation

## 2022-10-26 NOTE — Anesthesia Procedure Notes (Addendum)
Date/Time: 10/26/2022 7:40 AM  Performed by: Donna Bernard, CRNAPre-anesthesia Checklist: Patient identified, Emergency Drugs available, Suction available, Patient being monitored and Timeout performed Patient Re-evaluated:Patient Re-evaluated prior to induction Oxygen Delivery Method: Circle system utilized Preoxygenation: Pre-oxygenation with 100% oxygen Ventilation: Mask ventilation without difficulty LMA: LMA flexible inserted LMA Size: 5.0 Number of attempts: 1 Tube secured with: Tape Dental Injury: Teeth and Oropharynx as per pre-operative assessment

## 2022-10-27 ENCOUNTER — Encounter (HOSPITAL_COMMUNITY): Payer: Self-pay | Admitting: Urology

## 2022-11-02 ENCOUNTER — Other Ambulatory Visit: Payer: Self-pay | Admitting: Urology

## 2022-11-02 DIAGNOSIS — E119 Type 2 diabetes mellitus without complications: Secondary | ICD-10-CM | POA: Diagnosis not present

## 2022-11-02 DIAGNOSIS — H35373 Puckering of macula, bilateral: Secondary | ICD-10-CM | POA: Diagnosis not present

## 2022-11-03 DIAGNOSIS — N200 Calculus of kidney: Secondary | ICD-10-CM | POA: Diagnosis not present

## 2022-11-16 ENCOUNTER — Other Ambulatory Visit: Payer: Self-pay

## 2022-11-16 ENCOUNTER — Encounter (HOSPITAL_BASED_OUTPATIENT_CLINIC_OR_DEPARTMENT_OTHER): Payer: Self-pay | Admitting: Urology

## 2022-11-16 NOTE — Progress Notes (Signed)
Spoke w/ via phone for pre-op interview---pt Lab needs dos----  I stat       Lab results------10-21-2022 epic COVID test -----patient states asymptomatic no test needed Arrive at -------700 11-23-2022 NPO after MN NO Solid Food.  Clear liquids from MN until---600 Med rec completed Medications to take morning of surgery -----none Diabetic medication -----n/a Patient instructed no nail polish to be worn day of surgery Patient instructed to bring photo id and insurance card day of surgery Patient aware to have Driver (ride ) / caregiver  wife ruby   for 24 hours after surgery -  Patient Special Instructions -----none Pre-Op special Instructions -----none Patient verbalized understanding of instructions that were given at this phone interview. Patient denies chest pain, sob, fever, cough at the interview.

## 2022-11-23 ENCOUNTER — Ambulatory Visit (HOSPITAL_BASED_OUTPATIENT_CLINIC_OR_DEPARTMENT_OTHER): Payer: Medicare Other | Admitting: Anesthesiology

## 2022-11-23 ENCOUNTER — Encounter (HOSPITAL_BASED_OUTPATIENT_CLINIC_OR_DEPARTMENT_OTHER): Payer: Self-pay | Admitting: Urology

## 2022-11-23 ENCOUNTER — Ambulatory Visit (HOSPITAL_BASED_OUTPATIENT_CLINIC_OR_DEPARTMENT_OTHER): Payer: Self-pay | Admitting: Anesthesiology

## 2022-11-23 ENCOUNTER — Other Ambulatory Visit: Payer: Self-pay

## 2022-11-23 ENCOUNTER — Ambulatory Visit (HOSPITAL_BASED_OUTPATIENT_CLINIC_OR_DEPARTMENT_OTHER)
Admission: RE | Admit: 2022-11-23 | Discharge: 2022-11-23 | Disposition: A | Discharge status: Home / Self Care | Payer: Medicare Other | Attending: Urology | Admitting: Urology

## 2022-11-23 ENCOUNTER — Encounter (HOSPITAL_BASED_OUTPATIENT_CLINIC_OR_DEPARTMENT_OTHER)
Admission: RE | Disposition: A | Discharge status: Home / Self Care | Payer: Self-pay | Source: Home / Self Care | Attending: Urology

## 2022-11-23 DIAGNOSIS — Z01818 Encounter for other preprocedural examination: Secondary | ICD-10-CM

## 2022-11-23 DIAGNOSIS — Z79899 Other long term (current) drug therapy: Secondary | ICD-10-CM | POA: Insufficient documentation

## 2022-11-23 DIAGNOSIS — Z7984 Long term (current) use of oral hypoglycemic drugs: Secondary | ICD-10-CM | POA: Diagnosis not present

## 2022-11-23 DIAGNOSIS — E1122 Type 2 diabetes mellitus with diabetic chronic kidney disease: Secondary | ICD-10-CM | POA: Diagnosis not present

## 2022-11-23 DIAGNOSIS — N183 Chronic kidney disease, stage 3 unspecified: Secondary | ICD-10-CM | POA: Diagnosis not present

## 2022-11-23 DIAGNOSIS — N200 Calculus of kidney: Secondary | ICD-10-CM

## 2022-11-23 DIAGNOSIS — E119 Type 2 diabetes mellitus without complications: Secondary | ICD-10-CM | POA: Diagnosis not present

## 2022-11-23 DIAGNOSIS — N202 Calculus of kidney with calculus of ureter: Secondary | ICD-10-CM | POA: Diagnosis not present

## 2022-11-23 DIAGNOSIS — I129 Hypertensive chronic kidney disease with stage 1 through stage 4 chronic kidney disease, or unspecified chronic kidney disease: Secondary | ICD-10-CM | POA: Insufficient documentation

## 2022-11-23 DIAGNOSIS — I10 Essential (primary) hypertension: Secondary | ICD-10-CM | POA: Diagnosis not present

## 2022-11-23 DIAGNOSIS — N400 Enlarged prostate without lower urinary tract symptoms: Secondary | ICD-10-CM | POA: Diagnosis not present

## 2022-11-23 HISTORY — PX: CYSTOSCOPY/URETEROSCOPY/HOLMIUM LASER/STENT PLACEMENT: SHX6546

## 2022-11-23 HISTORY — DX: Thrombocytopenia, unspecified: D69.6

## 2022-11-23 LAB — POCT I-STAT, CHEM 8
BUN: 22 mg/dL (ref 8–23)
Calcium, Ion: 0.98 mmol/L — ABNORMAL LOW (ref 1.15–1.40)
Chloride: 108 mmol/L (ref 98–111)
Creatinine, Ser: 1.5 mg/dL — ABNORMAL HIGH (ref 0.61–1.24)
Glucose, Bld: 119 mg/dL — ABNORMAL HIGH (ref 70–99)
HCT: 33 % — ABNORMAL LOW (ref 39.0–52.0)
Hemoglobin: 11.2 g/dL — ABNORMAL LOW (ref 13.0–17.0)
Potassium: 3.9 mmol/L (ref 3.5–5.1)
Sodium: 141 mmol/L (ref 135–145)
TCO2: 21 mmol/L — ABNORMAL LOW (ref 22–32)

## 2022-11-23 LAB — GLUCOSE, CAPILLARY: Glucose-Capillary: 132 mg/dL — ABNORMAL HIGH (ref 70–99)

## 2022-11-23 SURGERY — CYSTOSCOPY/URETEROSCOPY/HOLMIUM LASER/STENT PLACEMENT
Anesthesia: General | Site: Ureter | Laterality: Left

## 2022-11-23 MED ORDER — FENTANYL CITRATE (PF) 100 MCG/2ML IJ SOLN
25.0000 ug | INTRAMUSCULAR | Status: DC | PRN
  Administered 2022-11-23 (×2): 50 ug via INTRAVENOUS

## 2022-11-23 MED ORDER — PHENYLEPHRINE HCL (PRESSORS) 10 MG/ML IV SOLN
INTRAVENOUS | Status: DC | PRN
Start: 2022-11-23 — End: 2022-11-23
  Administered 2022-11-23: 160 ug via INTRAVENOUS

## 2022-11-23 MED ORDER — ACETAMINOPHEN 500 MG PO TABS
ORAL_TABLET | ORAL | Status: AC
  Filled 2022-11-23: qty 2

## 2022-11-23 MED ORDER — KETOROLAC TROMETHAMINE 30 MG/ML IJ SOLN
INTRAMUSCULAR | Status: AC
  Filled 2022-11-23: qty 1

## 2022-11-23 MED ORDER — EPHEDRINE 5 MG/ML INJ
INTRAVENOUS | Status: AC
  Filled 2022-11-23: qty 5

## 2022-11-23 MED ORDER — EPHEDRINE SULFATE (PRESSORS) 50 MG/ML IJ SOLN
INTRAMUSCULAR | Status: DC | PRN
Start: 2022-11-23 — End: 2022-11-23
  Administered 2022-11-23: 5 mg via INTRAVENOUS

## 2022-11-23 MED ORDER — SODIUM CHLORIDE 0.9 % IV SOLN
INTRAVENOUS | Status: AC
  Filled 2022-11-23: qty 100

## 2022-11-23 MED ORDER — ONDANSETRON HCL 4 MG/2ML IJ SOLN
INTRAMUSCULAR | Status: DC | PRN
  Administered 2022-11-23: 4 mg via INTRAVENOUS

## 2022-11-23 MED ORDER — ONDANSETRON HCL 4 MG/2ML IJ SOLN
INTRAMUSCULAR | Status: AC
  Filled 2022-11-23: qty 2

## 2022-11-23 MED ORDER — PROPOFOL 10 MG/ML IV BOLUS
INTRAVENOUS | Status: DC | PRN
  Administered 2022-11-23: 20 mg via INTRAVENOUS
  Administered 2022-11-23: 120 mg via INTRAVENOUS
  Administered 2022-11-23: 30 mg via INTRAVENOUS
  Administered 2022-11-23: 20 mg via INTRAVENOUS

## 2022-11-23 MED ORDER — FENTANYL CITRATE (PF) 100 MCG/2ML IJ SOLN
INTRAMUSCULAR | Status: AC
  Filled 2022-11-23: qty 2

## 2022-11-23 MED ORDER — SODIUM CHLORIDE 0.9 % IR SOLN
Status: DC | PRN
  Administered 2022-11-23: 3000 mL

## 2022-11-23 MED ORDER — LIDOCAINE HCL (PF) 2 % IJ SOLN
INTRAMUSCULAR | Status: AC
  Filled 2022-11-23: qty 5

## 2022-11-23 MED ORDER — SODIUM CHLORIDE 0.9 % IV SOLN: INTRAVENOUS | Status: DC

## 2022-11-23 MED ORDER — PROPOFOL 10 MG/ML IV BOLUS
INTRAVENOUS | Status: AC
  Filled 2022-11-23: qty 20

## 2022-11-23 MED ORDER — FENTANYL CITRATE (PF) 100 MCG/2ML IJ SOLN
INTRAMUSCULAR | Status: DC | PRN
  Administered 2022-11-23: 75 ug via INTRAVENOUS
  Administered 2022-11-23: 25 ug via INTRAVENOUS

## 2022-11-23 MED ORDER — CEFTRIAXONE SODIUM 2 G IJ SOLR
INTRAMUSCULAR | Status: AC
  Filled 2022-11-23: qty 20

## 2022-11-23 MED ORDER — KETOROLAC TROMETHAMINE 30 MG/ML IJ SOLN
INTRAMUSCULAR | Status: DC | PRN
Start: 2022-11-23 — End: 2022-11-23
  Administered 2022-11-23: 15 mg via INTRAVENOUS

## 2022-11-23 MED ORDER — SODIUM CHLORIDE 0.9 % IV SOLN
2.0000 g | INTRAVENOUS | Status: AC
  Administered 2022-11-23: 2 g via INTRAVENOUS

## 2022-11-23 MED ORDER — ACETAMINOPHEN 500 MG PO TABS
1000.0000 mg | ORAL_TABLET | Freq: Once | ORAL | Status: AC
  Administered 2022-11-23: 1000 mg via ORAL

## 2022-11-23 MED ORDER — PHENYLEPHRINE 80 MCG/ML (10ML) SYRINGE FOR IV PUSH (FOR BLOOD PRESSURE SUPPORT)
PREFILLED_SYRINGE | INTRAVENOUS | Status: AC
  Filled 2022-11-23: qty 10

## 2022-11-23 SURGICAL SUPPLY — 23 items
BAG DRAIN URO-CYSTO SKYTR STRL (DRAIN) ×1 IMPLANT
BAG DRN UROCATH (DRAIN) ×1
CATH URETERAL DUAL LUMEN 10F (MISCELLANEOUS) IMPLANT
CATH URETL OPEN 5X70 (CATHETERS) IMPLANT
CLOTH BEACON ORANGE TIMEOUT ST (SAFETY) ×1 IMPLANT
DRSG TEGADERM 2-3/8X2-3/4 SM (GAUZE/BANDAGES/DRESSINGS) IMPLANT
EXTRACTOR STONE 1.7FRX115CM (UROLOGICAL SUPPLIES) IMPLANT
GLOVE BIO SURGEON STRL SZ7.5 (GLOVE) ×1 IMPLANT
GLOVE BIO SURGEON STRL SZ8 (GLOVE) IMPLANT
GOWN STRL REUS W/TWL LRG LVL3 (GOWN DISPOSABLE) ×1 IMPLANT
GUIDEWIRE ANG ZIPWIRE 038X150 (WIRE) IMPLANT
GUIDEWIRE STR DUAL SENSOR (WIRE) ×1 IMPLANT
GUIDEWIRE ZIPWRE .038 STRAIGHT (WIRE) IMPLANT
IV NS IRRIG 3000ML ARTHROMATIC (IV SOLUTION) ×2 IMPLANT
KIT TURNOVER CYSTO (KITS) ×1 IMPLANT
MANIFOLD NEPTUNE II (INSTRUMENTS) ×1 IMPLANT
NS IRRIG 500ML POUR BTL (IV SOLUTION) ×1 IMPLANT
PACK CYSTO (CUSTOM PROCEDURE TRAY) ×1 IMPLANT
SHEATH NAVIGATOR HD 11/13X36 (SHEATH) IMPLANT
SLEEVE SCD COMPRESS KNEE MED (STOCKING) ×1 IMPLANT
STENT URET 6FRX26 CONTOUR (STENTS) IMPLANT
TUBE CONNECTING 12X1/4 (SUCTIONS) ×1 IMPLANT
TUBING UROLOGY SET (TUBING) ×1 IMPLANT

## 2022-11-23 NOTE — Anesthesia Preprocedure Evaluation (Addendum)
Anesthesia Evaluation  Patient identified by MRN, date of birth, ID band Patient awake    Reviewed: Allergy & Precautions, NPO status , Patient's Chart, lab work & pertinent test results  Airway Mallampati: III  TM Distance: >3 FB Neck ROM: Full    Dental  (+) Teeth Intact, Dental Advisory Given   Pulmonary neg pulmonary ROS   Pulmonary exam normal breath sounds clear to auscultation       Cardiovascular hypertension, Pt. on medications Normal cardiovascular exam Rhythm:Regular Rate:Normal     Neuro/Psych negative neurological ROS  negative psych ROS   GI/Hepatic negative GI ROS, Neg liver ROS,,,  Endo/Other  diabetes, Type 2, Oral Hypoglycemic Agents    Renal/GU Renal InsufficiencyRenal disease  negative genitourinary   Musculoskeletal negative musculoskeletal ROS (+)    Abdominal   Peds  Hematology negative hematology ROS (+)   Anesthesia Other Findings   Reproductive/Obstetrics                             Anesthesia Physical Anesthesia Plan  ASA: 2  Anesthesia Plan: General   Post-op Pain Management: Tylenol PO (pre-op)*   Induction: Intravenous  PONV Risk Score and Plan: 2 and Ondansetron, Dexamethasone and Treatment may vary due to age or medical condition  Airway Management Planned: LMA  Additional Equipment:   Intra-op Plan:   Post-operative Plan: Extubation in OR  Informed Consent: I have reviewed the patients History and Physical, chart, labs and discussed the procedure including the risks, benefits and alternatives for the proposed anesthesia with the patient or authorized representative who has indicated his/her understanding and acceptance.     Dental advisory given  Plan Discussed with: CRNA  Anesthesia Plan Comments:        Anesthesia Quick Evaluation

## 2022-11-23 NOTE — Op Note (Signed)
Preoperative diagnosis: Left renal stones Postoperative diagnosis: Left renal stones, left ureteral stones  Procedure: Cystoscopy with left ureteroscopy laser lithotripsy and stone basket extraction, left ureteral stent exchange  Surgeon: Mena Goes  Anesthesia: General  Indication for procedure: Adam Mata is an 82 year old male with bilateral stones.  He underwent a staged bilateral ureteroscopy a few weeks ago and had bilateral stents placed with the right stent having a tether.  He remove the right stent.  He had left stones remaining and was brought back for staged procedure.  Findings: Cystoscopy was unremarkable.  Left stent in place.  Left ureteroscopy revealed 4 fragments in the left ureter 3 were extracted with the basket and 1 required fragmentation with the laser.  Some of the bladder fragments were sent for analysis.  In the left kidney a lower pole stone was noted with multiple other fragments which were dusted.  In the upper pole posterior calyx there were 2 stones remaining.  These were dusted with larger fragments basket extracted.  On exam under anesthesia the penis was normal without mass or lesion.  The glans and meatus appeared normal.  On DRE the prostate was about 30 g and smooth without hard area or nodule.  Description of procedure: After consent was obtained patient brought to the operating room.  After adequate anesthesia he is placed in lithotomy position and prepped and draped in the usual sterile fashion.  Timeout was performed to confirm the patient and procedure.  Cystoscope was passed per urethra the bladder carefully inspected.  The left ureteral stent was grasped with the graspers and removed through the urethral meatus.  A sensor wire was advanced through the stent and coiled in the upper calyx.  The stent was removed.  The stent was lightly encrusted.  I then gently passed a medium access sheath which met resistance in the distal ureter and therefore I removed the  access sheath and passed a semirigid ureteroscope where 3 fragments were noted at about the mid to distal ureter.  They were grasped and removed with a basket and dropped in the bladder.  I then repassed the semirigid scope up into the mid close to proximal ureter no other stone fragments were noted but on the way down encountered another fragment.  I could not quite get it engaged with the engage basket therefore I took the 242 m laser fiber and dusted this fragment/stone.  Reinspection of the ureter noted there to be no other significant stone fragments.  A zip wire was passed under direct vision and the scope backed out.  I then passed the access sheath which went up easily into the proximal ureter.  A dual channel digital ureteroscope was advanced through the access sheath.  I left the sensor wire as the safety.  In the kidney and the calyces were inspected.  A stone was noted in the lower pole and it was dusted and fragmented.  Then inspection found 2 stones up in a upper pole posterior calyx.  The smaller stone was fragmented and the pieces removed for analysis.  The larger stone was then fragmented and some of the fragments washed up into the upper calyx and 2 or 3 of the fragments were removed for analysis.  The remainder of the stone was dusted.  No other stones were noted on careful inspection of the collecting system nor significant stone fragment.  The access sheath was backed out on the ureteroscope and the collecting system renal pelvis ureter inspected on the way out noted  to be no injury.  Cystoscope was passed per urethra and the bladder inspected.  Some of the bladder fragments were evacuated and added for the stone analysis.  The wire was backloaded on the cystoscope and a 626 cm stent passed.  Wire was removed with a good coil seen in the kidney and a good coil in the bladder.  The bladder was drained and the scope removed.  Exam under anesthesia was performed.  He was awakened and taken the  cover room in stable condition.  Complications: None  Blood loss: Minimal  Specimens to office lab: Stone fragments  Drains: 6 x 26 cm left ureteral stent with string  Disposition: Patient stable to PACU.

## 2022-11-23 NOTE — Interval H&P Note (Signed)
History and Physical Interval Note:  11/23/2022 7:28 AM  Adam Mata  has presented today for surgery, with the diagnosis of LEFT RENAL STONE.  The various methods of treatment have been discussed with the patient and family. After consideration of risks, benefits and other options for treatment, the patient has consented to  Procedure(s) with comments: CYSTOSCOPY LEFT URETEROSCOPY/HOLMIUM LASER/STENT EXCHANGE (Left) - 75 MINS FOR CASE as a surgical intervention.  The patient's history has been reviewed, patient examined, no change in status, stable for surgery.  I have reviewed the patient's chart and labs.  Questions were answered to the patient's satisfaction.  He has been well without dysuria but has had some gross hematuria. No fever.  No cough or congestion.  He status post a right ureteroscopy with stent and tether.  He remove the stent.  He passed many stone fragments.  We treated several stones in the left kidney but he had 2 or 3 remaining.  He had about 8 on his preoperative CT.  He has a left ureteral stent.  He was seen 11/03/2022 in the office and urine culture was negative.  We discussed dietary changes to help prevent stones.  He does not take any calcium or magnesium in his diet and a daily multivitamin might be beneficial.  He does take potassium citrate.    [name]

## 2022-11-23 NOTE — Anesthesia Postprocedure Evaluation (Signed)
Anesthesia Post Note  Patient: Adam Mata  Procedure(s) Performed: CYSTOSCOPY; LEFT URETEROSCOPY/HOLMIUM LASER/STENT EXCHANGE (Left: Ureter)     Patient location during evaluation: PACU Anesthesia Type: General Level of consciousness: awake and alert Pain management: pain level controlled Vital Signs Assessment: post-procedure vital signs reviewed and stable Respiratory status: spontaneous breathing, nonlabored ventilation, respiratory function stable and patient connected to nasal cannula oxygen Cardiovascular status: blood pressure returned to baseline and stable Postop Assessment: no apparent nausea or vomiting Anesthetic complications: no  No notable events documented.  Last Vitals:  Vitals:   11/23/22 1100 11/23/22 1144  BP: (!) 109/95 (!) 117/58  Pulse: 80 63  Resp: 15 16  Temp:  (!) 36.1 C  SpO2: 95% 97%    Last Pain:  Vitals:   11/23/22 1144  TempSrc:   PainSc: 3                  Cassell Voorhies L Cira Deyoe

## 2022-11-23 NOTE — Transfer of Care (Signed)
Immediate Anesthesia Transfer of Care Note  Patient: Adam Mata  Procedure(s) Performed: Procedure(s) (LRB): CYSTOSCOPY; LEFT URETEROSCOPY/HOLMIUM LASER/STENT EXCHANGE (Left)  Patient Location: PACU  Anesthesia Type: General  Level of Consciousness: awake, oriented, sedated and patient cooperative  Airway & Oxygen Therapy: Patient Spontanous Breathing and Patient connected to face mask oxygen  Post-op Assessment: Report given to PACU RN and Post -op Vital signs reviewed and stable  Post vital signs: Reviewed and stable  Complications: No apparent anesthesia complications Last Vitals:  Vitals Value Taken Time  BP 155/82 11/23/22 1016  Temp    Pulse 90 11/23/22 1019  Resp 16 11/23/22 1019  SpO2 100 % 11/23/22 1019  Vitals shown include unfiled device data.  Last Pain:  Vitals:   11/23/22 0716  TempSrc: Oral      Patients Stated Pain Goal: 6 (11/23/22 0740)  Complications: No notable events documented.

## 2022-11-23 NOTE — Discharge Instructions (Addendum)
Remove the stent on Friday morning, November 26, 2022 by pulling the string as instructed.        No acetaminophen/Tylenol until after 1:45 pm today if needed.   No ibuprofen, Advil, Aleve, Motrin, ketorolac, meloxicam, naproxen, or other NSAIDS until after 4:00 pm today if needed.   Post Anesthesia Home Care Instructions  Activity: Get plenty of rest for the remainder of the day. A responsible individual must stay with you for 24 hours following the procedure.  For the next 24 hours, DO NOT: -Drive a car -Advertising copywriter -Drink alcoholic beverages -Take any medication unless instructed by your physician -Make any legal decisions or sign important papers.  Meals: Start with liquid foods such as gelatin or soup. Progress to regular foods as tolerated. Avoid greasy, spicy, heavy foods. If nausea and/or vomiting occur, drink only clear liquids until the nausea and/or vomiting subsides. Call your physician if vomiting continues.  Special Instructions/Symptoms: Your throat may feel dry or sore from the anesthesia or the breathing tube placed in your throat during surgery. If this causes discomfort, gargle with warm salt water. The discomfort should disappear within 24 hours.

## 2022-11-23 NOTE — Anesthesia Procedure Notes (Signed)
Procedure Name: LMA Insertion Date/Time: 11/23/2022 8:54 AM  Performed by: Francie Massing, CRNAPre-anesthesia Checklist: Patient identified, Emergency Drugs available, Suction available and Patient being monitored Patient Re-evaluated:Patient Re-evaluated prior to induction Oxygen Delivery Method: Circle system utilized Preoxygenation: Pre-oxygenation with 100% oxygen Induction Type: IV induction Ventilation: Mask ventilation without difficulty LMA: LMA inserted LMA Size: 4.0 Number of attempts: 1 Airway Equipment and Method: Bite block Placement Confirmation: positive ETCO2 Tube secured with: Tape Dental Injury: Teeth and Oropharynx as per pre-operative assessment

## 2022-11-24 ENCOUNTER — Encounter (HOSPITAL_BASED_OUTPATIENT_CLINIC_OR_DEPARTMENT_OTHER): Payer: Self-pay | Admitting: Urology

## 2022-11-25 ENCOUNTER — Other Ambulatory Visit: Payer: Self-pay

## 2022-11-25 ENCOUNTER — Emergency Department (HOSPITAL_COMMUNITY): Payer: Medicare Other

## 2022-11-25 ENCOUNTER — Observation Stay (HOSPITAL_COMMUNITY)
Admission: EM | Admit: 2022-11-25 | Discharge: 2022-11-27 | Disposition: A | Discharge status: Home / Self Care | Payer: Medicare Other | Attending: Family Medicine | Admitting: Family Medicine

## 2022-11-25 DIAGNOSIS — Z7722 Contact with and (suspected) exposure to environmental tobacco smoke (acute) (chronic): Secondary | ICD-10-CM | POA: Diagnosis not present

## 2022-11-25 DIAGNOSIS — R079 Chest pain, unspecified: Secondary | ICD-10-CM | POA: Diagnosis not present

## 2022-11-25 DIAGNOSIS — I700 Atherosclerosis of aorta: Secondary | ICD-10-CM | POA: Insufficient documentation

## 2022-11-25 DIAGNOSIS — E781 Pure hyperglyceridemia: Secondary | ICD-10-CM | POA: Insufficient documentation

## 2022-11-25 DIAGNOSIS — Z79899 Other long term (current) drug therapy: Secondary | ICD-10-CM | POA: Insufficient documentation

## 2022-11-25 DIAGNOSIS — K8020 Calculus of gallbladder without cholecystitis without obstruction: Secondary | ICD-10-CM | POA: Diagnosis not present

## 2022-11-25 DIAGNOSIS — R918 Other nonspecific abnormal finding of lung field: Secondary | ICD-10-CM | POA: Diagnosis not present

## 2022-11-25 DIAGNOSIS — I10 Essential (primary) hypertension: Secondary | ICD-10-CM | POA: Diagnosis present

## 2022-11-25 DIAGNOSIS — I2510 Atherosclerotic heart disease of native coronary artery without angina pectoris: Secondary | ICD-10-CM | POA: Diagnosis not present

## 2022-11-25 DIAGNOSIS — N1831 Chronic kidney disease, stage 3a: Secondary | ICD-10-CM | POA: Diagnosis not present

## 2022-11-25 DIAGNOSIS — R609 Edema, unspecified: Secondary | ICD-10-CM | POA: Insufficient documentation

## 2022-11-25 DIAGNOSIS — I129 Hypertensive chronic kidney disease with stage 1 through stage 4 chronic kidney disease, or unspecified chronic kidney disease: Secondary | ICD-10-CM | POA: Insufficient documentation

## 2022-11-25 DIAGNOSIS — Z7984 Long term (current) use of oral hypoglycemic drugs: Secondary | ICD-10-CM | POA: Diagnosis not present

## 2022-11-25 DIAGNOSIS — D696 Thrombocytopenia, unspecified: Secondary | ICD-10-CM | POA: Diagnosis present

## 2022-11-25 DIAGNOSIS — E785 Hyperlipidemia, unspecified: Secondary | ICD-10-CM | POA: Diagnosis present

## 2022-11-25 DIAGNOSIS — E669 Obesity, unspecified: Secondary | ICD-10-CM | POA: Diagnosis present

## 2022-11-25 DIAGNOSIS — E1122 Type 2 diabetes mellitus with diabetic chronic kidney disease: Secondary | ICD-10-CM | POA: Insufficient documentation

## 2022-11-25 DIAGNOSIS — R072 Precordial pain: Secondary | ICD-10-CM | POA: Diagnosis not present

## 2022-11-25 DIAGNOSIS — Z23 Encounter for immunization: Secondary | ICD-10-CM | POA: Diagnosis not present

## 2022-11-25 DIAGNOSIS — R0602 Shortness of breath: Secondary | ICD-10-CM | POA: Insufficient documentation

## 2022-11-25 DIAGNOSIS — R931 Abnormal findings on diagnostic imaging of heart and coronary circulation: Secondary | ICD-10-CM | POA: Diagnosis not present

## 2022-11-25 DIAGNOSIS — E119 Type 2 diabetes mellitus without complications: Secondary | ICD-10-CM

## 2022-11-25 DIAGNOSIS — R140 Abdominal distension (gaseous): Secondary | ICD-10-CM | POA: Diagnosis not present

## 2022-11-25 LAB — BASIC METABOLIC PANEL
Anion gap: 12 (ref 5–15)
BUN: 16 mg/dL (ref 8–23)
CO2: 26 mmol/L (ref 22–32)
Calcium: 9.2 mg/dL (ref 8.9–10.3)
Chloride: 104 mmol/L (ref 98–111)
Creatinine, Ser: 1.29 mg/dL — ABNORMAL HIGH (ref 0.61–1.24)
GFR, Estimated: 56 mL/min — ABNORMAL LOW (ref >60)
Glucose, Bld: 152 mg/dL — ABNORMAL HIGH (ref 70–99)
Potassium: 4.1 mmol/L (ref 3.5–5.1)
Sodium: 142 mmol/L (ref 135–145)

## 2022-11-25 LAB — CBC
HCT: 34.8 % — ABNORMAL LOW (ref 39.0–52.0)
Hemoglobin: 11.1 g/dL — ABNORMAL LOW (ref 13.0–17.0)
MCH: 30.2 pg (ref 26.0–34.0)
MCHC: 31.9 g/dL (ref 30.0–36.0)
MCV: 94.8 fL (ref 80.0–100.0)
Platelets: 83 10*3/uL — ABNORMAL LOW (ref 150–400)
RBC: 3.67 MIL/uL — ABNORMAL LOW (ref 4.22–5.81)
RDW: 13.2 % (ref 11.5–15.5)
WBC: 4.5 10*3/uL (ref 4.0–10.5)
nRBC: 0 % (ref 0.0–0.2)

## 2022-11-25 LAB — TROPONIN I (HIGH SENSITIVITY): Troponin I (High Sensitivity): 5 ng/L (ref <18)

## 2022-11-25 NOTE — ED Provider Triage Note (Signed)
Emergency Medicine Provider Triage Evaluation Note  Baylin Suba , a 82 y.o. male  was evaluated in triage.  Pt complains of CP.  Review of Systems  Positive: Chest pain, chest tightness Negative: Shortness of breath, weakness  Physical Exam  BP 126/69   Pulse 89   Temp 98.2 F (36.8 C)   Resp 18   SpO2 96%  Gen:   Awake, no distress   Resp:  Normal effort  MSK:   Moves extremities without difficulty  Other:    Medical Decision Making  Medically screening exam initiated at 9:10 PM.  Appropriate orders placed.  Geovany Schoenbachler was informed that the remainder of the evaluation will be completed by another provider, this initial triage assessment does not replace that evaluation, and the importance of remaining in the ED until their evaluation is complete.  Patient is diabetic, has never had history of heart attack, no history of heart stents.  Patient is not on a blood thinner.  Reports chest pain that was relieved after taking nitro, was already given aspirin by EMS.  Hemodynamically stable, no acute ST changes on EKG.    Smitty Knudsen, Cordelia Poche 11/25/22 2112

## 2022-11-25 NOTE — ED Notes (Signed)
Patient evaluated by PA  advised RN that patient can be transferred to waiting area .

## 2022-11-25 NOTE — ED Triage Notes (Signed)
Patient arrived with EMS from home reports central chest pain this evening , denies SOB , no emesis or diaphoresis . He received 324 mg ASA and 2 NTG sl prior to arrival with mild relief 4/10 pain scale .

## 2022-11-26 ENCOUNTER — Emergency Department (HOSPITAL_COMMUNITY): Payer: Medicare Other

## 2022-11-26 ENCOUNTER — Other Ambulatory Visit: Payer: Self-pay | Admitting: Cardiology

## 2022-11-26 ENCOUNTER — Encounter (HOSPITAL_COMMUNITY): Payer: Self-pay | Admitting: Internal Medicine

## 2022-11-26 ENCOUNTER — Observation Stay (HOSPITAL_BASED_OUTPATIENT_CLINIC_OR_DEPARTMENT_OTHER): Payer: Medicare Other

## 2022-11-26 ENCOUNTER — Observation Stay (HOSPITAL_BASED_OUTPATIENT_CLINIC_OR_DEPARTMENT_OTHER)
Admit: 2022-11-26 | Discharge: 2022-11-26 | Disposition: A | Discharge status: Home / Self Care | Payer: Medicare Other | Attending: Cardiology | Admitting: Cardiology

## 2022-11-26 DIAGNOSIS — I2510 Atherosclerotic heart disease of native coronary artery without angina pectoris: Secondary | ICD-10-CM

## 2022-11-26 DIAGNOSIS — E785 Hyperlipidemia, unspecified: Secondary | ICD-10-CM | POA: Diagnosis not present

## 2022-11-26 DIAGNOSIS — R0602 Shortness of breath: Secondary | ICD-10-CM | POA: Diagnosis not present

## 2022-11-26 DIAGNOSIS — R609 Edema, unspecified: Secondary | ICD-10-CM | POA: Diagnosis not present

## 2022-11-26 DIAGNOSIS — E781 Pure hyperglyceridemia: Secondary | ICD-10-CM | POA: Diagnosis not present

## 2022-11-26 DIAGNOSIS — I209 Angina pectoris, unspecified: Secondary | ICD-10-CM

## 2022-11-26 DIAGNOSIS — R918 Other nonspecific abnormal finding of lung field: Secondary | ICD-10-CM | POA: Diagnosis not present

## 2022-11-26 DIAGNOSIS — N1831 Chronic kidney disease, stage 3a: Secondary | ICD-10-CM | POA: Diagnosis not present

## 2022-11-26 DIAGNOSIS — Z7722 Contact with and (suspected) exposure to environmental tobacco smoke (acute) (chronic): Secondary | ICD-10-CM | POA: Diagnosis not present

## 2022-11-26 DIAGNOSIS — I200 Unstable angina: Secondary | ICD-10-CM | POA: Diagnosis not present

## 2022-11-26 DIAGNOSIS — R072 Precordial pain: Secondary | ICD-10-CM | POA: Diagnosis not present

## 2022-11-26 DIAGNOSIS — Z7984 Long term (current) use of oral hypoglycemic drugs: Secondary | ICD-10-CM | POA: Diagnosis not present

## 2022-11-26 DIAGNOSIS — R079 Chest pain, unspecified: Secondary | ICD-10-CM

## 2022-11-26 DIAGNOSIS — K8020 Calculus of gallbladder without cholecystitis without obstruction: Secondary | ICD-10-CM | POA: Diagnosis not present

## 2022-11-26 DIAGNOSIS — Z79899 Other long term (current) drug therapy: Secondary | ICD-10-CM | POA: Diagnosis not present

## 2022-11-26 DIAGNOSIS — E1122 Type 2 diabetes mellitus with diabetic chronic kidney disease: Secondary | ICD-10-CM | POA: Diagnosis not present

## 2022-11-26 DIAGNOSIS — R931 Abnormal findings on diagnostic imaging of heart and coronary circulation: Secondary | ICD-10-CM | POA: Diagnosis not present

## 2022-11-26 DIAGNOSIS — I129 Hypertensive chronic kidney disease with stage 1 through stage 4 chronic kidney disease, or unspecified chronic kidney disease: Secondary | ICD-10-CM | POA: Diagnosis not present

## 2022-11-26 DIAGNOSIS — R140 Abdominal distension (gaseous): Secondary | ICD-10-CM | POA: Diagnosis not present

## 2022-11-26 DIAGNOSIS — Z23 Encounter for immunization: Secondary | ICD-10-CM | POA: Diagnosis not present

## 2022-11-26 LAB — HEPATIC FUNCTION PANEL
ALT: 35 U/L (ref 0–44)
AST: 81 U/L — ABNORMAL HIGH (ref 15–41)
Albumin: 3.6 g/dL (ref 3.5–5.0)
Alkaline Phosphatase: 77 U/L (ref 38–126)
Bilirubin, Direct: 0.3 mg/dL — ABNORMAL HIGH (ref 0.0–0.2)
Indirect Bilirubin: 0.5 mg/dL (ref 0.3–0.9)
Total Bilirubin: 0.8 mg/dL (ref 0.3–1.2)
Total Protein: 6.5 g/dL (ref 6.5–8.1)

## 2022-11-26 LAB — LIPID PANEL
Cholesterol: 151 mg/dL (ref 0–200)
HDL: 32 mg/dL — ABNORMAL LOW (ref >40)
LDL Cholesterol: 77 mg/dL (ref 0–99)
Total CHOL/HDL Ratio: 4.7 {ratio}
Triglycerides: 208 mg/dL — ABNORMAL HIGH (ref <150)
VLDL: 42 mg/dL — ABNORMAL HIGH (ref 0–40)

## 2022-11-26 LAB — URINALYSIS, ROUTINE W REFLEX MICROSCOPIC
Bacteria, UA: NONE SEEN
Bilirubin Urine: NEGATIVE
Glucose, UA: NEGATIVE mg/dL
Ketones, ur: NEGATIVE mg/dL
Leukocytes,Ua: NEGATIVE
Nitrite: NEGATIVE
Protein, ur: NEGATIVE mg/dL
RBC / HPF: >50 RBC/hpf (ref 0–5)
Specific Gravity, Urine: 1.011 (ref 1.005–1.030)
pH: 7 (ref 5.0–8.0)

## 2022-11-26 LAB — TSH: TSH: 2.541 u[IU]/mL (ref 0.350–4.500)

## 2022-11-26 LAB — BRAIN NATRIURETIC PEPTIDE: B Natriuretic Peptide: 62 pg/mL (ref 0.0–100.0)

## 2022-11-26 LAB — ECHOCARDIOGRAM COMPLETE
Area-P 1/2: 2.28 cm2
Height: 68 in
S' Lateral: 2.8 cm
Weight: 3494.4 [oz_av]

## 2022-11-26 LAB — PROTEIN / CREATININE RATIO, URINE
Creatinine, Urine: 27 mg/dL
Protein Creatinine Ratio: 0.48 mg/mg{creat} — ABNORMAL HIGH (ref 0.00–0.15)
Total Protein, Urine: 13 mg/dL

## 2022-11-26 LAB — CBG MONITORING, ED: Glucose-Capillary: 103 mg/dL — ABNORMAL HIGH (ref 70–99)

## 2022-11-26 LAB — TROPONIN I (HIGH SENSITIVITY)
Troponin I (High Sensitivity): 2 ng/L (ref <18)
Troponin I (High Sensitivity): 5 ng/L (ref <18)

## 2022-11-26 LAB — GLUCOSE, CAPILLARY: Glucose-Capillary: 120 mg/dL — ABNORMAL HIGH (ref 70–99)

## 2022-11-26 MED ORDER — METOPROLOL TARTRATE 5 MG/5ML IV SOLN: 5.0000 mg | Freq: Once | INTRAVENOUS | Status: AC

## 2022-11-26 MED ORDER — IOHEXOL 350 MG/ML SOLN
75.0000 mL | Freq: Once | INTRAVENOUS | Status: AC | PRN
  Administered 2022-11-26: 75 mL via INTRAVENOUS

## 2022-11-26 MED ORDER — METOPROLOL TARTRATE 100 MG PO TABS
100.0000 mg | ORAL_TABLET | Freq: Once | ORAL | Status: AC
  Administered 2022-11-26: 100 mg via ORAL
  Filled 2022-11-26: qty 1

## 2022-11-26 MED ORDER — INFLUENZA VAC A&B SURF ANT ADJ 0.5 ML IM SUSY
0.5000 mL | PREFILLED_SYRINGE | INTRAMUSCULAR | Status: AC
  Administered 2022-11-27: 0.5 mL via INTRAMUSCULAR
  Filled 2022-11-26: qty 0.5

## 2022-11-26 MED ORDER — EZETIMIBE 10 MG PO TABS
10.0000 mg | ORAL_TABLET | Freq: Every day | ORAL | Status: DC
  Administered 2022-11-26 – 2022-11-27 (×2): 10 mg via ORAL
  Filled 2022-11-26 (×2): qty 1

## 2022-11-26 MED ORDER — IOHEXOL 350 MG/ML SOLN
100.0000 mL | Freq: Once | INTRAVENOUS | Status: AC | PRN
  Administered 2022-11-26: 100 mL via INTRAVENOUS

## 2022-11-26 MED ORDER — ACETAMINOPHEN 500 MG PO TABS: 1000.0000 mg | ORAL_TABLET | Freq: Four times a day (QID) | ORAL | Status: DC | PRN

## 2022-11-26 MED ORDER — INSULIN ASPART 100 UNIT/ML IJ SOLN: 0.0000 [IU] | Freq: Three times a day (TID) | INTRAMUSCULAR | Status: DC

## 2022-11-26 MED ORDER — ASPIRIN 81 MG PO CHEW
81.0000 mg | CHEWABLE_TABLET | Freq: Every day | ORAL | Status: DC
  Administered 2022-11-26 – 2022-11-27 (×2): 81 mg via ORAL
  Filled 2022-11-26 (×2): qty 1

## 2022-11-26 MED ORDER — NITROGLYCERIN 0.4 MG SL SUBL
0.8000 mg | SUBLINGUAL_TABLET | Freq: Once | SUBLINGUAL | Status: AC
  Administered 2022-11-26: 0.8 mg via SUBLINGUAL

## 2022-11-26 MED ORDER — NITROGLYCERIN 0.4 MG SL SUBL
SUBLINGUAL_TABLET | SUBLINGUAL | Status: AC
  Filled 2022-11-26: qty 2

## 2022-11-26 MED ORDER — METOPROLOL TARTRATE 5 MG/5ML IV SOLN
INTRAVENOUS | Status: AC
  Filled 2022-11-26: qty 5

## 2022-11-26 MED ORDER — IVABRADINE HCL 5 MG PO TABS: 5.0000 mg | ORAL_TABLET | Freq: Once | ORAL | Status: DC

## 2022-11-26 MED ORDER — SODIUM CHLORIDE 0.9% FLUSH
3.0000 mL | Freq: Two times a day (BID) | INTRAVENOUS | Status: DC
  Administered 2022-11-26 (×2): 3 mL via INTRAVENOUS

## 2022-11-26 NOTE — Progress Notes (Signed)
   Cardiac CTA today with calcium score of 685 (62nd percentile). Moderate atherosclerosis. 25-49% ostial/mid/distal LAD; 50-69% mid LAD; 25-49% prox/mid LCx; 25-49% diffuse RCA, possible >50% soft plaque prox/mid RCA. FFR analysis demonstrates possible borderlinehemodynamically flow limiting lesion in the mid to distal LAD (FFR 0.93>0.87>0.78). Will need to consider staged outpatient cardiac cath due to CKD/DM if TTE shows normal LVEF.   Perlie Gold, PA-C

## 2022-11-26 NOTE — H&P (Signed)
History and Physical    Adam Mata GLO:[id number] DOB: [birth year] DOA: 11/25/2022  PCP: Gus Height, PA   Patient coming from: Home   Chief Complaint:  Chief Complaint  Patient presents with   Chest Pain    HPI:  Adam Mata is a 82 y.o. male with hx of hypertension, hyperlipidemia, diabetes, thrombocytopenia, renal stones with recent lithotripsy and left ureteral stent placement on 9/24, presents after episode of chest pain.  Reports the chest pain began around 7 PM.  While he was at rest.  Described as elephant sitting on his chest.  No radiation of pain.  No other associated symptoms, no shortness of breath, diaphoresis, nausea, vomiting.  EMS administered 324 mg aspirin chewed, and sublingual nitroglycerin x 2.  After these treatments pain reduced in severity, and has gradually faded.  No chest pain at time of interview.  Reports overall limited activity, but during yard work, taking a flight of stairs he has no chest pain or shortness of breath.  He has no prior cardiac history.  Reports remote history of stress test in his 20s done by the [place]. no prior history of cath.   Review of Systems:  ROS complete and negative except as marked above   Allergies  Allergen Reactions   Fluvastatin Other (See Comments)    Cramps   Lovastatin Other (See Comments)    Cramps   Meloxicam     Other Reaction(s): Elevated blood pressure   Pravastatin Other (See Comments)    Cramps   Simvastatin Other (See Comments)    Cramps   Tamsulosin Other (See Comments)    Not sure reaction    Prior to Admission medications   Medication Sig Start Date End Date Taking? Authorizing Provider  fluticasone (FLONASE) 50 MCG/ACT nasal spray Place 1 spray into both nostrils daily as needed for allergies. 09/15/20   [provider]  meloxicam (MOBIC) 15 MG tablet Take 15 mg by mouth 3 (three) times a week. 05/12/21   [provider]  metFORMIN (GLUCOPHAGE-XR) 750 MG 24 hr tablet Take  750 mg by mouth 2 (two) times daily.    [provider]  oxybutynin (DITROPAN) 5 MG tablet Take 5 mg by mouth at bedtime.    [provider]  potassium citrate (UROCIT-K) 5 MEQ (540 MG) SR tablet Take 15 mEq by mouth 2 (two) times daily. 3 tabs in am 3 tabs in pm    [provider]  valsartan (DIOVAN) 80 MG tablet Take 80 mg by mouth daily. 09/13/22   [provider]    Past Medical History:  Diagnosis Date   CKD (chronic kidney disease), stage III (HCC)    nephrologist--- dr Marisue Humble [facility])   History of gout    per pt last episode many yrs ago   History of kidney stones    Hyperlipidemia    Hypertension    followed by pcp   MRSA infection 2020   Right ureteral calculus    Thrombocytopenia (HCC)    followed by dr Gus Height, platlet count runs low   Type 2 diabetes mellitus (HCC)    followed by pcp   (06-17-2020 pt stated does not check blood sugar at home)   Wears glasses     Past Surgical History:  Procedure Laterality Date   CARPAL TUNNEL RELEASE Right 07/25/2012   Procedure: CARPAL TUNNEL RELEASE;  Surgeon: Wyn Forster., MD;  Location: [facility];  Service: Orthopedics;  Laterality: Right;  CARPAL TUNNEL RELEASE Left 09/19/2012   Procedure: LEFT CARPAL TUNNEL RELEASE;  Surgeon: Wyn Forster., MD;  Location: [facility];  Service: Orthopedics;  Laterality: Left;   CATARACT EXTRACTION W/ INTRAOCULAR LENS IMPLANT Right 2017   COLONOSCOPY     CYSTOSCOPY/RETROGRADE/URETEROSCOPY/STONE EXTRACTION WITH BASKET Right 06/24/2020   Procedure: CYSTOSCOPY/bILATERAL Shan Levans /URETEROSCOPY/HOLMIUM LASER RIGHT  LITHOTRIPSY/RIGHT STONE EXTRACTION WITH BASKET/ STENT PLACEMENT;  Surgeon: [name], MD;  Location: [facility];  Service: Urology;  Laterality: Right;   CYSTOSCOPY/URETEROSCOPY/HOLMIUM LASER/STENT PLACEMENT  x3  last one 01-22-2020   CYSTOSCOPY/URETEROSCOPY/HOLMIUM  LASER/STENT PLACEMENT Bilateral 10/26/2022   Procedure: CYSTOSCOPY BILATERAL RETROGRADE PYELOGRAM BILATERAL URETEROSCOPY/HOLMIUM LASER/STENT PLACEMENT;  Surgeon: [name], MD;  Location: [facility];  Service: Urology;  Laterality: Bilateral;  90 MINS   CYSTOSCOPY/URETEROSCOPY/HOLMIUM LASER/STENT PLACEMENT Left 11/23/2022   Procedure: CYSTOSCOPY; LEFT URETEROSCOPY/HOLMIUM LASER/STENT EXCHANGE;  Surgeon: [name], MD;  Location: [facility];  Service: Urology;  Laterality: Left;   TONSILLECTOMY  child   TRIGGER FINGER RELEASE Left 09/19/2012   Procedure: RELEASE TRIGGER FINGER/A-1 PULLEY LEFT LONG FINGER;  Surgeon: Wyn Forster., MD;  Location: [facility];  Service: Orthopedics;  Laterality: Left;     reports that he has never smoked. He has been exposed to tobacco smoke. He has never used smokeless tobacco. He reports that he does not currently use alcohol. He reports that he does not use drugs.  Family History  Problem Relation Age of Onset   Stroke Mother    Arthritis Mother    Heart attack Mother    Heart attack Father    Stroke Father      Physical Exam: Vitals:   11/25/22 2100 11/26/22 0300 11/26/22 0600  BP: 126/69 (!) 157/88 (!) 151/92  Pulse: 89 (!) 58 (!) 54  Resp: 18 18 (!) 8  Temp: 98.2 F (36.8 C) 98 F (36.7 C)   SpO2: 96% 99% 99%    Gen: Awake, alert, NAD   CV: Regular, normal S1, S2, no murmurs  Resp: Normal WOB, CTAB  Abd: Flat, normoactive, nontender MSK: Symmetric, trace to 1+ pitting edema near the ankles.  Skin: Faint petechiae over the lower extremities.  No other rashes or lesions Neuro: Alert and interactive  Psych: euthymic, appropriate    Data review:   Labs reviewed, notable for:  High-sensitivity Trop 5, repeated at 5 AST 81 Other LFT normal A1c in 8/'24 6.8%  Micro:  Results for orders placed or performed during the hospital encounter of 10/21/22  Surgical pcr screen     Status: None    Collection Time: 10/21/22  2:52 PM   Specimen: Nasal Mucosa; Nasal Swab  Result Value Ref Range Status   MRSA, PCR NEGATIVE NEGATIVE Final   Staphylococcus aureus NEGATIVE NEGATIVE Final    Comment: (NOTE) The Xpert SA Assay (FDA approved for NASAL specimens in patients 81 years of age and older), is one component of a comprehensive surveillance program. It is not intended to diagnose infection nor to guide or monitor treatment. Performed at [facility], [address], [place], [place] [address]     Imaging reviewed:  CT Angio Chest PE W/Cm &/Or Wo Cm  Result Date: 11/26/2022 CLINICAL DATA:  Chest pain. EXAM: CT ANGIOGRAPHY CHEST WITH CONTRAST TECHNIQUE: Multidetector CT imaging of the chest was performed using the standard protocol during bolus administration of intravenous contrast. Multiplanar CT image reconstructions and MIPs were obtained to evaluate the vascular anatomy. RADIATION  DOSE REDUCTION: This exam was performed according to the departmental dose-optimization program which includes automated exposure control, adjustment of the mA and/or kV according to patient size and/or use of iterative reconstruction technique. CONTRAST:  75mL OMNIPAQUE IOHEXOL 350 MG/ML SOLN COMPARISON:  None Available. FINDINGS: Cardiovascular: There is mild calcification of the aortic arch, without evidence of aortic aneurysm. Satisfactory opacification of the pulmonary arteries to the segmental level. No evidence of pulmonary embolism. Normal heart size with moderate severity coronary artery calcification. No pericardial effusion. Mediastinum/Nodes: No enlarged mediastinal, hilar, or axillary lymph nodes. Thyroid gland, trachea, and esophagus demonstrate no significant findings. Lungs/Pleura: Mild right basilar linear scarring and/or atelectasis is seen with additional mild atelectatic changes noted throughout the periphery of the left lung. No acute infiltrate, pleural effusion or  pneumothorax is identified. Upper Abdomen: Numerous subcentimeter gallstones are seen within the lumen of a mildly distended gallbladder. A 1.9 cm predominately fat density left adrenal mass is seen (approximately -62.60 Hounsfield units). Musculoskeletal: No chest wall abnormality. No acute or significant osseous findings. Review of the MIP images confirms the above findings. IMPRESSION: 1. No evidence of pulmonary embolism. 2. Mild right basilar linear scarring and/or atelectasis. 3. Cholelithiasis. 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 5. Aortic atherosclerosis. Aortic Atherosclerosis (ICD10-I70.0). Electronically Signed   By: Aram Candela M.D.   On: 11/26/2022 03:00   DG Chest 2 View  Result Date: 11/25/2022 CLINICAL DATA:  Chest pain EXAM: CHEST - 2 VIEW COMPARISON:  Chest x-ray 05/21/2008 FINDINGS: There are minimal patchy airspace opacities in the inferior left upper lobe. There is no pleural effusion or pneumothorax. The cardiomediastinal silhouette is within normal limits. No acute fractures are seen. IMPRESSION: Minimal patchy airspace opacities in the inferior left upper lobe, which could be infectious or inflammatory. Follow-up PA and lateral chest x-ray recommended in 4-6 weeks to confirm resolution. Electronically Signed   By: Darliss Cheney M.D.   On: 11/25/2022 23:12    EKG:  Sinus rhythm with inferior Q waves, poor R wave progression.  No acute ischemic changes  ED Course:  Was treated with 324 mg aspirin by EMS, and 2 sublingual nitroglycerin.   Assessment/Plan:  82 y.o. male with hx hypertension, hyperlipidemia, diabetes, thrombocytopenia, renal stones with recent lithotripsy and left ureteral stent placement on 9/24, presents after episode of chest pain.   Chest pain, possible angina Typical quality of pain, relieved with nitroglycerin.  Although no history of prior exertional symptoms or cardiac history.  Does have multiple risk factors for coronary disease as included in  history of above.  EKG with inferior Q waves and poor R wave progression possibly indicating old infarcts, but no acute ischemic changes.  High-sensitivity Trop is within normal limit at 5.  -Repeat high-sensitivity Trop  -Cardiology consult in AM.  Possible stress test. NPO for now  -Status post 324 mg aspirin by EMS, continue aspirin 81 mg p.o. for now.;  Note that he has a history of thrombocytopenia with platelets currently 83 -Reportedly intolerant to multiple statins.  Started on Zetia 10 mg daily -Check lipids, has recent A1c last month of 6.8% -Tele monitoring  Lower extremity edema  - Add on BNP, TSH, UA/ UPCR.    Chronic medical problems: Hypertension: valsartan nonformulary Hyperlipidemia: See above, start Zetia Diabetes: Hold home metformin.  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  Continue monitoring CBC, would stop aspirin if no ischemic findings on additional cardiac workup.  There is no  height or weight on file to calculate BMI.    DVT prophylaxis:  SCDs Code Status:  Full Code ; note he was very undecided during discussions about CODE STATUS.  He would not want to be indefinitely sustained on life support.  Offered social work assistance and help completing advanced directive but for now defers.  Wants to be full code for now.   Diet:  Diet Orders (From admission, onward)     Start     Ordered   11/26/22 0645  Diet NPO time specified Except for: Sips with Meds  Diet effective now       Question:  Except for  Answer:  Clearance Coots with Meds   11/26/22 0645           Family Communication: Yes discussed with his wife at the bedside Consults: Cardiology in a.m. Admission status:   Observation, Telemetry bed  Severity of Illness: The appropriate patient status for this patient is OBSERVATION. Observation status is judged to be reasonable and necessary in order to provide the required intensity of  service to ensure the patient's safety. The patient's presenting symptoms, physical exam findings, and initial radiographic and laboratory data in the context of their medical condition is felt to place them at decreased risk for further clinical deterioration. Furthermore, it is anticipated that the patient will be medically stable for discharge from the hospital within 2 midnights of admission.    Dolly Rias, MD [facility]  How to contact the [facility] Attending or Consulting provider 7A - 7P or covering provider during after hours 7P -7A, for this patient.  Check the care team in [facility] and look for a) attending/consulting [facility] provider listed and b) the [facility] team listed Log into [email or web address] and use [place]'s universal password to access. If you do not have the password, please contact the hospital operator. Locate the [facility] provider you are looking for under [facility] and page to a number that you can be directly reached. If you still have difficulty reaching the provider, please page the [facility] (Director on Call) for the Hospitalists listed on amion for assistance.  11/26/2022, 6:49 AM

## 2022-11-26 NOTE — Progress Notes (Signed)
   Patient seen and examined at bedside, patient admitted after midnight, please see earlier detailed admission note by Dolly Rias, MD. Briefly, patient presented secondary to substernal chest pain at rest, responsive to nitroglycerin concerning for possible cardiac etiology.  Subjective: No chest pain this morning. Feels well. Ambulated today without chest pain or dyspnea.  BP (!) 151/92   Pulse (!) 54   Temp 98 F (36.7 C)   Resp (!) 8   SpO2 99%   General exam: Appears calm and comfortable Respiratory system: Clear to auscultation. Respiratory effort normal. Cardiovascular system: S1 & S2 heard, RRR. Gastrointestinal system: Abdomen is nondistended, soft and nontender. Normal bowel sounds heard. Central nervous system: Alert and oriented. No focal neurological deficits. Musculoskeletal: No calf tenderness Psychiatry: Judgement and insight appear normal. Mood & affect appropriate.   Brief assessment/Plan:  Chest pain Some typical and atypical features. Possibly unstable angina. Symptoms relieved with nitroglycerin and are now resolved. Troponin is negative. EKG with nonspecific EKG changes and evidence of possible prior ischemic disease. Family history of heart attack and stroke in mother and father. -Cardiology consult -Transthoracic Echocardiogram  Lower extremity edema Noted. BNP normal. Transthoracic Echocardiogram ordered.  Thrombocytopenia Patient previously followed by hematology/oncology. 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 Stable.  Primary hypertension Hyperlipidemia Diabetes Thrombocytopenia Per H&P   Family communication: Wife at bedside DVT prophylaxis: SCDs Disposition: Discharge home pending ongoing cardiac workup/recommendations  Jacquelin Hawking, MD [facility] 11/26/2022, 7:34 AM

## 2022-11-26 NOTE — ED Notes (Signed)
ED TO INPATIENT HANDOFF REPORT  ED Nurse Name and Phone #: Morrie Sheldon [phone]  S Name/Age/Gender Adam Mata 82 y.o. male Room/Bed: 020C/020C  Code Status   Code Status: Full Code  Home/SNF/Other Home Patient oriented to: self, place, time, and situation Is this baseline? Yes   Triage Complete: Triage complete  Chief Complaint Chest pain [R07.9]  Triage Note Patient arrived with EMS from home reports central chest pain this evening , denies SOB , no emesis or diaphoresis . He received 324 mg ASA and 2 NTG sl prior to arrival with mild relief 4/10 pain scale .    Allergies Allergies  Allergen Reactions   Fluvastatin Other (See Comments)    Cramps   Lovastatin Other (See Comments)    Cramps   Meloxicam     Other Reaction(s): Elevated blood pressure   Pravastatin Other (See Comments)    Cramps   Simvastatin Other (See Comments)    Cramps   Tamsulosin Other (See Comments)    Not sure reaction    Level of Care/Admitting Diagnosis ED Disposition     ED Disposition  Admit   Condition  --   Comment  Hospital Area: [facility] [[id number]]  Level of Care: Telemetry Cardiac [[id number]]  May place patient in observation at [facility] or [facility] if equivalent level of care is available:: No  Covid Evaluation: Asymptomatic - no recent exposure (last 10 days) testing not required  Diagnosis: Chest pain [161096]  Admitting Physician: Dolly Rias [[id number]]  Attending Physician: Dolly Rias [[id number]]          B Medical/Surgery History Past Medical History:  Diagnosis Date   CKD (chronic kidney disease), stage III (HCC)    nephrologist--- dr Marisue Humble [facility])   History of gout    per pt last episode many yrs ago   History of kidney stones    Hyperlipidemia    Hypertension    followed by pcp   MRSA infection 2020   Right ureteral calculus    Thrombocytopenia (HCC)    followed by dr Gus Height, platlet count runs low   Type 2  diabetes mellitus (HCC)    followed by pcp   (06-17-2020 pt stated does not check blood sugar at home)   Wears glasses    Past Surgical History:  Procedure Laterality Date   CARPAL TUNNEL RELEASE Right 07/25/2012   Procedure: CARPAL TUNNEL RELEASE;  Surgeon: Wyn Forster., MD;  Location: [facility];  Service: Orthopedics;  Laterality: Right;   CARPAL TUNNEL RELEASE Left 09/19/2012   Procedure: LEFT CARPAL TUNNEL RELEASE;  Surgeon: Wyn Forster., MD;  Location: [facility];  Service: Orthopedics;  Laterality: Left;   CATARACT EXTRACTION W/ INTRAOCULAR LENS IMPLANT Right 2017   COLONOSCOPY     CYSTOSCOPY/RETROGRADE/URETEROSCOPY/STONE EXTRACTION WITH BASKET Right 06/24/2020   Procedure: CYSTOSCOPY/bILATERAL Shan Levans /URETEROSCOPY/HOLMIUM LASER RIGHT  LITHOTRIPSY/RIGHT STONE EXTRACTION WITH BASKET/ STENT PLACEMENT;  Surgeon: [name], MD;  Location: [facility];  Service: Urology;  Laterality: Right;   CYSTOSCOPY/URETEROSCOPY/HOLMIUM LASER/STENT PLACEMENT  x3  last one 01-22-2020   CYSTOSCOPY/URETEROSCOPY/HOLMIUM LASER/STENT PLACEMENT Bilateral 10/26/2022   Procedure: CYSTOSCOPY BILATERAL RETROGRADE PYELOGRAM BILATERAL URETEROSCOPY/HOLMIUM LASER/STENT PLACEMENT;  Surgeon: [name], MD;  Location: [facility];  Service: Urology;  Laterality: Bilateral;  90 MINS   CYSTOSCOPY/URETEROSCOPY/HOLMIUM LASER/STENT PLACEMENT Left 11/23/2022   Procedure: CYSTOSCOPY; LEFT URETEROSCOPY/HOLMIUM LASER/STENT EXCHANGE;  Surgeon: [name], MD;  Location: [facility];  Service: Urology;  Laterality: Left;   TONSILLECTOMY  child   TRIGGER FINGER RELEASE Left 09/19/2012   Procedure: RELEASE TRIGGER FINGER/A-1 PULLEY LEFT LONG FINGER;  Surgeon: Wyn Forster., MD;  Location: [facility];  Service: Orthopedics;  Laterality: Left;     A IV Location/Drains/Wounds Patient Lines/Drains/Airways Status      Active Line/Drains/Airways     Name Placement date Placement time Site Days   Peripheral IV 11/26/22 20 G Right Antecubital 11/26/22  0038  Antecubital  less than 1   Ureteral Drain/Stent Right ureter 6 Fr. 10/26/22  0853  Right ureter  31   Ureteral Drain/Stent Left ureter 6 Fr. 11/23/22  0959  Left ureter  3            Intake/Output Last 24 hours No intake or output data in the 24 hours ending 11/26/22 0807  Labs/Imaging Results for orders placed or performed during the hospital encounter of 11/25/22 (from the past 48 hour(s))  Basic metabolic panel     Status: Abnormal   Collection Time: 11/25/22  9:08 PM  Result Value Ref Range   Sodium 142 135 - 145 mmol/L   Potassium 4.1 3.5 - 5.1 mmol/L   Chloride 104 98 - 111 mmol/L   CO2 26 22 - 32 mmol/L   Glucose, Bld 152 (H) 70 - 99 mg/dL    Comment: Glucose reference range applies only to samples taken after fasting for at least 8 hours.   BUN 16 8 - 23 mg/dL   Creatinine, Ser 0.93 (H) 0.61 - 1.24 mg/dL   Calcium 9.2 8.9 - 81.8 mg/dL   GFR, Estimated 56 (L) >60 mL/min    Comment: (NOTE) Calculated using the CKD-EPI Creatinine Equation (2021)    Anion gap 12 5 - 15    Comment: Performed at [facility], [address] [place], [place] [address]  CBC     Status: Abnormal   Collection Time: 11/25/22  9:08 PM  Result Value Ref Range   WBC 4.5 4.0 - 10.5 K/uL   RBC 3.67 (L) 4.22 - 5.81 MIL/uL   Hemoglobin 11.1 (L) 13.0 - 17.0 g/dL   HCT 16.9 (L) 67.8 - 93.8 %   MCV 94.8 80.0 - 100.0 fL   MCH 30.2 26.0 - 34.0 pg   MCHC 31.9 30.0 - 36.0 g/dL   RDW 10.1 75.1 - 02.5 %   Platelets 83 (L) 150 - 400 K/uL    Comment: Immature Platelet Fraction may be clinically indicated, consider ordering this additional test [id number] REPEATED TO VERIFY    nRBC 0.0 0.0 - 0.2 %    Comment: Performed at [facility], [address], [place], [place] [address]  Troponin I (High Sensitivity)     Status: None   Collection Time:  11/25/22  9:08 PM  Result Value Ref Range   Troponin I (High Sensitivity) 5 <18 ng/L    Comment: (NOTE) Elevated high sensitivity troponin I (hsTnI) values and significant  changes across serial measurements may suggest ACS but many other  chronic and acute conditions are known to elevate hsTnI results.  Refer to the "Links" section for chest pain algorithms and additional  guidance. Performed at [facility], [address] [place], [place] [address]   Lipid panel     Status: Abnormal   Collection Time: 11/25/22  9:08 PM  Result Value Ref Range   Cholesterol 151 0 - 200 mg/dL   Triglycerides 540 (H) <150  mg/dL   HDL 32 (L) >21 mg/dL   Total CHOL/HDL Ratio 4.7 RATIO   VLDL 42 (H) 0 - 40 mg/dL   LDL Cholesterol 77 0 - 99 mg/dL    Comment:        Total Cholesterol/HDL:CHD Risk Coronary Heart Disease Risk Table                     Men   Women  1/2 Average Risk   3.4   3.3  Average Risk       5.0   4.4  2 X Average Risk   9.6   7.1  3 X Average Risk  23.4   11.0        Use the calculated Patient Ratio above and the CHD Risk Table to determine the patient's CHD Risk.        ATP III CLASSIFICATION (LDL):  <100     mg/dL   Optimal  308-657  mg/dL   Near or Above                    Optimal  130-159  mg/dL   Borderline  846-962  mg/dL   High  >952     mg/dL   Very High Performed at [facility], [address], [place], [place] [address]   Brain natriuretic peptide     Status: None   Collection Time: 11/25/22  9:08 PM  Result Value Ref Range   B Natriuretic Peptide 62.0 0.0 - 100.0 pg/mL    Comment: Performed at [facility], [address], [place], [place] [address]  Troponin I (High Sensitivity)     Status: None   Collection Time: 11/25/22 11:19 PM  Result Value Ref Range   Troponin I (High Sensitivity) 5 <18 ng/L    Comment: (NOTE) Elevated high sensitivity troponin I (hsTnI) values and significant  changes across serial measurements may suggest ACS  but many other  chronic and acute conditions are known to elevate hsTnI results.  Refer to the "Links" section for chest pain algorithms and additional  guidance. Performed at [facility], [address] [place], [place] [address]   Hepatic function panel     Status: Abnormal   Collection Time: 11/25/22 11:19 PM  Result Value Ref Range   Total Protein 6.5 6.5 - 8.1 g/dL   Albumin 3.6 3.5 - 5.0 g/dL   AST 81 (H) 15 - 41 U/L   ALT 35 0 - 44 U/L   Alkaline Phosphatase 77 38 - 126 U/L   Total Bilirubin 0.8 0.3 - 1.2 mg/dL   Bilirubin, Direct 0.3 (H) 0.0 - 0.2 mg/dL   Indirect Bilirubin 0.5 0.3 - 0.9 mg/dL    Comment: Performed at [facility], [address], [place], [place] [address]   CT Angio Chest PE W/Cm &/Or Wo Cm  Result Date: 11/26/2022 CLINICAL DATA:  Chest pain. EXAM: CT ANGIOGRAPHY CHEST WITH CONTRAST TECHNIQUE: Multidetector CT imaging of the chest was performed using the standard protocol during bolus administration of intravenous contrast. Multiplanar CT image reconstructions and MIPs were obtained to evaluate the vascular anatomy. RADIATION DOSE REDUCTION: This exam was performed according to the departmental dose-optimization program which includes automated exposure control, adjustment of the mA and/or kV according to patient size and/or use of iterative reconstruction technique. CONTRAST:  75mL OMNIPAQUE IOHEXOL 350 MG/ML SOLN COMPARISON:  None Available. FINDINGS: Cardiovascular: There is mild calcification of  the aortic arch, without evidence of aortic aneurysm. Satisfactory opacification of the pulmonary arteries to the segmental level. No evidence of pulmonary embolism. Normal heart size with moderate severity coronary artery calcification. No pericardial effusion. Mediastinum/Nodes: No enlarged mediastinal, hilar, or axillary lymph nodes. Thyroid gland, trachea, and esophagus demonstrate no significant findings. Lungs/Pleura: Mild right basilar linear scarring  and/or atelectasis is seen with additional mild atelectatic changes noted throughout the periphery of the left lung. No acute infiltrate, pleural effusion or pneumothorax is identified. Upper Abdomen: Numerous subcentimeter gallstones are seen within the lumen of a mildly distended gallbladder. A 1.9 cm predominately fat density left adrenal mass is seen (approximately -62.60 Hounsfield units). Musculoskeletal: No chest wall abnormality. No acute or significant osseous findings. Review of the MIP images confirms the above findings. IMPRESSION: 1. No evidence of pulmonary embolism. 2. Mild right basilar linear scarring and/or atelectasis. 3. Cholelithiasis. 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 5. Aortic atherosclerosis. Aortic Atherosclerosis (ICD10-I70.0). Electronically Signed   By: Aram Candela M.D.   On: 11/26/2022 03:00   DG Chest 2 View  Result Date: 11/25/2022 CLINICAL DATA:  Chest pain EXAM: CHEST - 2 VIEW COMPARISON:  Chest x-ray 05/21/2008 FINDINGS: There are minimal patchy airspace opacities in the inferior left upper lobe. There is no pleural effusion or pneumothorax. The cardiomediastinal silhouette is within normal limits. No acute fractures are seen. IMPRESSION: Minimal patchy airspace opacities in the inferior left upper lobe, which could be infectious or inflammatory. Follow-up PA and lateral chest x-ray recommended in 4-6 weeks to confirm resolution. Electronically Signed   By: Darliss Cheney M.D.   On: 11/25/2022 23:12    Pending Labs Unresulted Labs (From admission, onward)     Start     Ordered   11/26/22 0708  Urinalysis, Routine w reflex microscopic -Urine, Clean Catch  Once,   R       Question:  Specimen Source  Answer:  Urine, Clean Catch   11/26/22 0707   11/26/22 0708  Protein / creatinine ratio, urine  Once,   R        11/26/22 0707   11/26/22 0708  TSH  Add-on,   AD        11/26/22 0707            Vitals/Pain Today's Vitals   11/26/22 0430 11/26/22 0515  11/26/22 0600 11/26/22 0713  BP: (!) 146/75 (!) 141/70 (!) 151/92   Pulse: 75 (!) 55 (!) 54   Resp: 20 11 (!) 8   Temp:    98 F (36.7 C)  SpO2: 99% 99% 99%   PainSc:        Isolation Precautions No active isolations  Medications Medications  sodium chloride flush (NS) 0.9 % injection 3 mL (has no administration in time range)  acetaminophen (TYLENOL) tablet 1,000 mg (has no administration in time range)  aspirin chewable tablet 81 mg (has no administration in time range)  ezetimibe (ZETIA) tablet 10 mg (has no administration in time range)  insulin aspart (novoLOG) injection 0-6 Units (has no administration in time range)  iohexol (OMNIPAQUE) 350 MG/ML injection 75 mL (75 mLs Intravenous Contrast Given 11/26/22 0104)    Mobility walks     Focused Assessments See chart   R Recommendations: See Admitting Provider Note  Report given to:   Additional Notes: see chart

## 2022-11-26 NOTE — Progress Notes (Signed)
  Echocardiogram 2D Echocardiogram has been performed.  Adam Mata 11/26/2022, 5:09 PM

## 2022-11-26 NOTE — Consult Note (Addendum)
Cardiology Consultation   Patient ID: Mcarther Malley MRN: [id number]; DOB: [birth year]  Admit date: 11/25/2022 Date of Consult: 11/26/2022  PCP:  Gus Height, PA   [facility] Providers Cardiologist:  None        Patient Profile:   Altus Smedberg is a 82 y.o. male with a hx of hypertension, hyperlipidemia, thrombocytopenia, CKD IIIa, DM, nephrolithiasis who is being seen 11/26/2022 for the evaluation of chest pain at the request of Dr. Lazarus Salines.  History of Present Illness:   Mr. Calverley presented to the emergency department on the evening of 9/26 via EMS due to chest pressure.  Patient received 324 mg of aspirin and nitroglycerin x 2 with EMS with improvement in chest discomfort.  Patient was sitting watching TV when his discomfort began.  He had no shortness of breath, diaphoresis, lightheadedness/dizziness, nausea.  For a moment he considered that his symptoms were acid reflux but when his pain persisted, made the decision to call EMS.  Patient says that he had full cessation of chest pain within about an hour of arriving to the emergency department.  Patient reports that he is not all that physically active but denies any previous episodes of chest discomfort or shortness of breath with exertion.  Patient does not have any cardiac history, has never seen a cardiologist.  The patient, he was recently transition from losartan to valsartan primarily to provide additional renal protection.  He reports that home blood pressures are typically very well-controlled.   Past Medical History:  Diagnosis Date   CKD (chronic kidney disease), stage III [facility])    nephrologist--- dr Marisue Humble [facility])   History of gout    per pt last episode many yrs ago   History of kidney stones    Hyperlipidemia    Hypertension    followed by pcp   MRSA infection 2020   Right ureteral calculus    Thrombocytopenia (HCC)    followed by dr Gus Height, platlet count runs low   Type 2  diabetes mellitus (HCC)    followed by pcp   (06-17-2020 pt stated does not check blood sugar at home)   Wears glasses     Past Surgical History:  Procedure Laterality Date   CARPAL TUNNEL RELEASE Right 07/25/2012   Procedure: CARPAL TUNNEL RELEASE;  Surgeon: Wyn Forster., MD;  Location: [facility];  Service: Orthopedics;  Laterality: Right;   CARPAL TUNNEL RELEASE Left 09/19/2012   Procedure: LEFT CARPAL TUNNEL RELEASE;  Surgeon: Wyn Forster., MD;  Location:  SURGERY CENTER;  Service: Orthopedics;  Laterality: Left;   CATARACT EXTRACTION W/ INTRAOCULAR LENS IMPLANT Right 2017   COLONOSCOPY     CYSTOSCOPY/RETROGRADE/URETEROSCOPY/STONE EXTRACTION WITH BASKET Right 06/24/2020   Procedure: CYSTOSCOPY/bILATERAL Shan Levans /URETEROSCOPY/HOLMIUM LASER RIGHT  LITHOTRIPSY/RIGHT STONE EXTRACTION WITH BASKET/ STENT PLACEMENT;  Surgeon: [name], MD;  Location: [facility];  Service: Urology;  Laterality: Right;   CYSTOSCOPY/URETEROSCOPY/HOLMIUM LASER/STENT PLACEMENT  x3  last one 01-22-2020   CYSTOSCOPY/URETEROSCOPY/HOLMIUM LASER/STENT PLACEMENT Bilateral 10/26/2022   Procedure: CYSTOSCOPY BILATERAL RETROGRADE PYELOGRAM BILATERAL URETEROSCOPY/HOLMIUM LASER/STENT PLACEMENT;  Surgeon: [name], MD;  Location: [facility];  Service: Urology;  Laterality: Bilateral;  90 MINS   CYSTOSCOPY/URETEROSCOPY/HOLMIUM LASER/STENT PLACEMENT Left 11/23/2022   Procedure: CYSTOSCOPY; LEFT URETEROSCOPY/HOLMIUM LASER/STENT EXCHANGE;  Surgeon: [name], MD;  Location: [facility];  Service: Urology;  Laterality: Left;   TONSILLECTOMY  child   TRIGGER FINGER RELEASE Left 09/19/2012   Procedure: RELEASE TRIGGER  FINGER/A-1 PULLEY LEFT LONG FINGER;  Surgeon: Wyn Forster., MD;  Location: [facility];  Service: Orthopedics;  Laterality: Left;     Home Medications:  Prior to Admission medications   Medication Sig  Start Date End Date Taking? Authorizing Provider  fluticasone (FLONASE) 50 MCG/ACT nasal spray Place 2 sprays into both nostrils as needed for allergies. 09/15/20  Yes [provider]  meloxicam (MOBIC) 15 MG tablet Take 15 mg by mouth in the morning and at bedtime. 05/12/21  Yes [provider]  metFORMIN (GLUCOPHAGE-XR) 750 MG 24 hr tablet Take 750 mg by mouth 2 (two) times daily.   Yes [provider]  oxybutynin (DITROPAN) 5 MG tablet Take 5 mg by mouth at bedtime.   Yes [provider]  potassium citrate (UROCIT-K) 5 MEQ (540 MG) SR tablet Take 15 mEq by mouth 2 (two) times daily.   Yes [provider]  valsartan (DIOVAN) 80 MG tablet Take 80 mg by mouth daily. 09/13/22  Yes [provider]    Inpatient Medications: Scheduled Meds:  aspirin  81 mg Oral Daily   ezetimibe  10 mg Oral Daily   insulin aspart  0-6 Units Subcutaneous TID WC   sodium chloride flush  3 mL Intravenous Q12H   Continuous Infusions:  PRN Meds: acetaminophen  Allergies:    Allergies  Allergen Reactions   Fluvastatin Other (See Comments)    Cramps   Lovastatin Other (See Comments)    Cramps   Meloxicam     Other Reaction(s): Elevated blood pressure   Pravastatin Other (See Comments)    Cramps   Simvastatin Other (See Comments)    Cramps   Tamsulosin Other (See Comments)    Not sure reaction    Social History:   Social History   Socioeconomic History   Marital status: Married    Spouse name: Not on file   Number of children: 1   Years of education: college   Highest education level: Not on file  Occupational History   Occupation: Advertising account planner [employer]  Tobacco Use   Smoking status: Never    Passive exposure: Past   Smokeless tobacco: Never  Vaping Use   Vaping status: Never Used  Substance and Sexual Activity   Alcohol use: Not Currently    Comment: last drink 40 years ago   Drug use: Never    Sexual activity: Not on file  Other Topics Concern   Not on file  Social History Narrative   Not on file   Social Determinants of Health   Financial Resource Strain: Not on file  Food Insecurity: Not on file  Transportation Needs: Not on file  Physical Activity: Not on file  Stress: Not on file  Social Connections: Not on file  Intimate Partner Violence: Not on file    Family History:    Family History  Problem Relation Age of Onset   Stroke Mother    Arthritis Mother    Heart attack Mother    Heart attack Father    Stroke Father      ROS:  Please see the history of present illness.   All other ROS reviewed and negative.     Physical Exam/Data:   Vitals:   11/26/22 0713 11/26/22 0742 11/26/22 0816 11/26/22 0859  BP:   (!) 153/65 (!) 152/92  Pulse:   85 84  Resp:   14 16  Temp: 98 F (36.7  C)   97.6 F (36.4 C)  TempSrc:    Oral  SpO2:  100% 97% 94%  Weight:    99.1 kg  Height:    5\' 8"  (1.727 m)   No intake or output data in the 24 hours ending 11/26/22 0949    11/26/2022    8:59 AM 11/23/2022    7:16 AM 11/16/2022    2:12 PM  Last 3 Weights  Weight (lbs) 218 lb 6.4 oz 217 lb 9.6 oz 217 lb  Weight (kg) 99.066 kg 98.703 kg 98.431 kg     Body mass index is 33.21 kg/m.  General:  Well nourished, well developed, in no acute distress HEENT: normal Neck: no JVD Vascular: No carotid bruits; Distal pulses 2+ bilaterally Cardiac:  normal S1, S2; RRR; no murmur  Lungs:  clear to auscultation bilaterally, no wheezing, rhonchi or rales  Abd: soft, nontender, no hepatomegaly  Ext: no edema Musculoskeletal:  No deformities, BUE and BLE strength normal and equal Skin: warm and dry  Neuro:  CNs 2-12 intact, no focal abnormalities noted Psych:  Normal affect   EKG:  The EKG was personally reviewed and demonstrates:  sinus rhythm with LAFB pattern (rS pattern in II, III, AVF with left axis deviation and qR complexes in I an AVL) Telemetry:  Telemetry was personally  reviewed and demonstrates:  sinus rhythm, borderline first degree AVB. Rates are quite variable, from 60s-90s.  Relevant CV Studies:  Echocardiogram pending  Laboratory Data:  High Sensitivity Troponin:   Recent Labs  Lab 11/25/22 2108 11/25/22 2319  TROPONINIHS 5 2  5      Chemistry Recent Labs  Lab 11/23/22 0801 11/25/22 2108  NA 141 142  K 3.9 4.1  CL 108 104  CO2  --  26  GLUCOSE 119* 152*  BUN 22 16  CREATININE 1.50* 1.29*  CALCIUM  --  9.2  GFRNONAA  --  56*  ANIONGAP  --  12    Recent Labs  Lab 11/25/22 2319  PROT 6.5  ALBUMIN 3.6  AST 81*  ALT 35  ALKPHOS 77  BILITOT 0.8   Lipids  Recent Labs  Lab 11/25/22 2108  CHOL 151  TRIG 208*  HDL 32*  LDLCALC 77  CHOLHDL 4.7    Hematology Recent Labs  Lab 11/23/22 0801 11/25/22 2108  WBC  --  4.5  RBC  --  3.67*  HGB 11.2* 11.1*  HCT 33.0* 34.8*  MCV  --  94.8  MCH  --  30.2  MCHC  --  31.9  RDW  --  13.2  PLT  --  83*   Thyroid  Recent Labs  Lab 11/25/22 2319  TSH 2.541    BNP Recent Labs  Lab 11/25/22 2108  BNP 62.0    DDimer No results for input(s): "DDIMER" in the last 168 hours.   Radiology/Studies:  CT Angio Chest PE W/Cm &/Or Wo Cm  Result Date: 11/26/2022 CLINICAL DATA:  Chest pain. EXAM: CT ANGIOGRAPHY CHEST WITH CONTRAST TECHNIQUE: Multidetector CT imaging of the chest was performed using the standard protocol during bolus administration of intravenous contrast. Multiplanar CT image reconstructions and MIPs were obtained to evaluate the vascular anatomy. RADIATION DOSE REDUCTION: This exam was performed according to the departmental dose-optimization program which includes automated exposure control, adjustment of the mA and/or kV according to patient size and/or use of iterative reconstruction technique. CONTRAST:  75mL OMNIPAQUE IOHEXOL 350 MG/ML SOLN COMPARISON:  None Available. FINDINGS: Cardiovascular: There is mild  calcification of the aortic arch, without evidence of  aortic aneurysm. Satisfactory opacification of the pulmonary arteries to the segmental level. No evidence of pulmonary embolism. Normal heart size with moderate severity coronary artery calcification. No pericardial effusion. Mediastinum/Nodes: No enlarged mediastinal, hilar, or axillary lymph nodes. Thyroid gland, trachea, and esophagus demonstrate no significant findings. Lungs/Pleura: Mild right basilar linear scarring and/or atelectasis is seen with additional mild atelectatic changes noted throughout the periphery of the left lung. No acute infiltrate, pleural effusion or pneumothorax is identified. Upper Abdomen: Numerous subcentimeter gallstones are seen within the lumen of a mildly distended gallbladder. A 1.9 cm predominately fat density left adrenal mass is seen (approximately -62.60 Hounsfield units). Musculoskeletal: No chest wall abnormality. No acute or significant osseous findings. Review of the MIP images confirms the above findings. IMPRESSION: 1. No evidence of pulmonary embolism. 2. Mild right basilar linear scarring and/or atelectasis. 3. Cholelithiasis. 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 5. Aortic atherosclerosis. Aortic Atherosclerosis (ICD10-I70.0). Electronically Signed   By: Aram Candela M.D.   On: 11/26/2022 03:00   DG Chest 2 View  Result Date: 11/25/2022 CLINICAL DATA:  Chest pain EXAM: CHEST - 2 VIEW COMPARISON:  Chest x-ray 05/21/2008 FINDINGS: There are minimal patchy airspace opacities in the inferior left upper lobe. There is no pleural effusion or pneumothorax. The cardiomediastinal silhouette is within normal limits. No acute fractures are seen. IMPRESSION: Minimal patchy airspace opacities in the inferior left upper lobe, which could be infectious or inflammatory. Follow-up PA and lateral chest x-ray recommended in 4-6 weeks to confirm resolution. Electronically Signed   By: Darliss Cheney M.D.   On: 11/25/2022 23:12     Assessment and Plan:   Chest  tightness Coronary artery calcification  Patient with multiple risk factors for coronary artery disease admitted after an episode of chest pressure last night.  Pain has since fully resolved with improvement in blood pressure.  High-sensitivity troponin negative x 3: 5, 5, 2.  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   Given patient's risk factors as well as chest tightness in the setting of episode of acute hypertension and moderate severity coronary artery calcifications, will evaluate his coronary anatomy with cardiac CTA today. Echocardiogram pending. Cardiology will follow. LDL of 77 is reasonable with patient intolerant to multiple statins with history of myalgias, currently on Zetia 10 mg.  Will plan to refer patient to lipid clinic for evaluation of his candidacy for PCSK9 therapy. Given patient's chronic thrombocytopenia, decision to continue daily aspirin more complex. If moderate to severe stenosis on CTA, benefits likely greater than risk. Will discuss further with attending physician.  Hypertension  Per patient, recently transition from losartan to valsartan.  Typical home blood pressures are well-controlled under 130 systolic.  Yesterday at the same time patient developed his chest pressure, blood pressure found by EMS to be in the 180s systolic.  Patient has not received antihypertensive medications today.  Given our plan for cardiac CTA and need for beta-blocker to reduce heart rate, would hold off on administering until after his study.  Risk Assessment/Risk Scores:                For questions or updates, please contact  [facility] Please consult [email or web address] for contact info under    Signed, Perlie Gold, PA-C  11/26/2022 9:49 AM   I have seen and examined the patient along with .  I have reviewed the chart, notes and new data.   I agree with PA/NP's note.  Key new complaints: currently asymptomatic. Presentation highly suggestive of unstable angina with NTG responsive chest pressure at rest. 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 Normal serial hs Trop I. Lipid profile with low HDL and elevated TG c/w metabolic sd./high vascular risk. History of statin myopathy.  PLAN: Coronary CT angio today. If severe coronary stenoses are seen, may need staged cardiac cath due to CKD/DM (will decide if inpatient or outpatient based on degree jeopardy). If he has heavy plaque burden, start on PCSK9 inh (he has VA benefits). This procedure has been fully reviewed with the patient and written informed consent has been obtained.   Thurmon Fair, MD, [facility] [phone] 11/26/2022, 11:44 AM

## 2022-11-26 NOTE — ED Provider Notes (Signed)
[place] EMERGENCY DEPARTMENT AT [facility] Provider Note   CSN: [id number] Arrival date & time: 11/25/22  2055     History  Chief Complaint  Patient presents with   Chest Pain    Adam Mata is a 82 y.o. male.  The history is provided by the patient and medical records.  Chest Pain  Adam Mata is a 82 y.o. male who presents to the Emergency Department complaining of chest pain.  He presents to the emergency department by EMS for evaluation of chest pain that started at rest while he was watching TV around 730.  He described it as an elephant sitting on his chest.  Pain was constant for as long as it took for him to call 911 and arrived to the hospital.  He was treated with 4 baby aspirin, 2 nitroglycerin by EMS prior to ED arrival with resolution of his pain.  No associated fevers, difficulty breathing, diaphoresis, nausea, vomiting, abdominal pain, leg swelling.  No prior similar symptoms.  He does get indigestion from time to time, but this felt different.  He has had 2 recent surgeries for kidney stones and does have a ureteral stent in place.  No history of DVT/PE.   Hx/o HTN, DM, CKD, thrombocytopenia, HPL No hx/o cad.  Family hx/o cad in parents.  No tobacco, alcohol, drugs.     Home Medications Prior to Admission medications   Medication Sig Start Date End Date Taking? Authorizing Provider  fluticasone (FLONASE) 50 MCG/ACT nasal spray Place 1 spray into both nostrils daily as needed for allergies. 09/15/20   [provider]  meloxicam (MOBIC) 15 MG tablet Take 15 mg by mouth 3 (three) times a week. 05/12/21   [provider]  metFORMIN (GLUCOPHAGE-XR) 750 MG 24 hr tablet Take 750 mg by mouth 2 (two) times daily.    [provider]  oxybutynin (DITROPAN) 5 MG tablet Take 5 mg by mouth at bedtime.    [provider]  potassium citrate (UROCIT-K) 5 MEQ (540 MG) SR tablet Take 15 mEq by mouth 2 (two) times daily. 3 tabs in  am 3 tabs in pm    [provider]  valsartan (DIOVAN) 80 MG tablet Take 80 mg by mouth daily. 09/13/22   [provider]      Allergies    Fluvastatin, Lovastatin, Meloxicam, Pravastatin, Simvastatin, and Tamsulosin    Review of Systems   Review of Systems  Cardiovascular:  Positive for chest pain.  All other systems reviewed and are negative.   Physical Exam Updated Vital Signs BP (!) 151/92   Pulse (!) 54   Temp 98 F (36.7 C)   Resp (!) 8   SpO2 99%  Physical Exam Vitals and nursing note reviewed.  Constitutional:      Appearance: He is well-developed.  HENT:     Head: Normocephalic and atraumatic.  Cardiovascular:     Rate and Rhythm: Normal rate and regular rhythm.     Heart sounds: No murmur heard. Pulmonary:     Effort: Pulmonary effort is normal. No respiratory distress.     Breath sounds: Normal breath sounds.  Abdominal:     Palpations: Abdomen is soft.     Tenderness: There is no abdominal tenderness. There is no guarding or rebound.  Musculoskeletal:        General: No tenderness.     Comments: 1+ pitting edema to BLE  Skin:    General: Skin is warm and dry.  Neurological:     Mental Status: He is alert and oriented to person, place, and time.  Psychiatric:        Behavior: Behavior normal.     ED Results / Procedures / Treatments   Labs (all labs ordered are listed, but only abnormal results are displayed) Labs Reviewed  BASIC METABOLIC PANEL - Abnormal; Notable for the following components:      Result Value   Glucose, Bld 152 (*)    Creatinine, Ser 1.29 (*)    GFR, Estimated 56 (*)    All other components within normal limits  CBC - Abnormal; Notable for the following components:   RBC 3.67 (*)    Hemoglobin 11.1 (*)    HCT 34.8 (*)    Platelets 83 (*)    All other components within normal limits  HEPATIC FUNCTION PANEL - Abnormal; Notable for the following components:   AST 81 (*)    Bilirubin, Direct 0.3 (*)    All  other components within normal limits  LIPID PANEL  BRAIN NATRIURETIC PEPTIDE  CBG MONITORING, ED  TROPONIN I (HIGH SENSITIVITY)  TROPONIN I (HIGH SENSITIVITY)  TROPONIN I (HIGH SENSITIVITY)    EKG EKG Interpretation Date/Time:  Thursday November 25 2022 20:40:39 EDT Ventricular Rate:  90 PR Interval:  192 QRS Duration:  96 QT Interval:  376 QTC Calculation: 459 R Axis:   -51  Text Interpretation: Normal sinus rhythm Left axis deviation Low voltage QRS Inferior infarct , age undetermined Cannot rule out Anterior infarct , age undetermined Abnormal ECG Confirmed by Tilden Fossa [id number]) on 11/26/2022 12:07:20 AM  Radiology CT Angio Chest PE W/Cm &/Or Wo Cm  Result Date: 11/26/2022 CLINICAL DATA:  Chest pain. EXAM: CT ANGIOGRAPHY CHEST WITH CONTRAST TECHNIQUE: Multidetector CT imaging of the chest was performed using the standard protocol during bolus administration of intravenous contrast. Multiplanar CT image reconstructions and MIPs were obtained to evaluate the vascular anatomy. RADIATION DOSE REDUCTION: This exam was performed according to the departmental dose-optimization program which includes automated exposure control, adjustment of the mA and/or kV according to patient size and/or use of iterative reconstruction technique. CONTRAST:  75mL OMNIPAQUE IOHEXOL 350 MG/ML SOLN COMPARISON:  None Available. FINDINGS: Cardiovascular: There is mild calcification of the aortic arch, without evidence of aortic aneurysm. Satisfactory opacification of the pulmonary arteries to the segmental level. No evidence of pulmonary embolism. Normal heart size with moderate severity coronary artery calcification. No pericardial effusion. Mediastinum/Nodes: No enlarged mediastinal, hilar, or axillary lymph nodes. Thyroid gland, trachea, and esophagus demonstrate no significant findings. Lungs/Pleura: Mild right basilar linear scarring and/or atelectasis is seen with additional mild atelectatic changes noted  throughout the periphery of the left lung. No acute infiltrate, pleural effusion or pneumothorax is identified. Upper Abdomen: Numerous subcentimeter gallstones are seen within the lumen of a mildly distended gallbladder. A 1.9 cm predominately fat density left adrenal mass is seen (approximately -62.60 Hounsfield units). Musculoskeletal: No chest wall abnormality. No acute or significant osseous findings. Review of the MIP images confirms the above findings. IMPRESSION: 1. No evidence of pulmonary embolism. 2. Mild right basilar linear scarring and/or atelectasis. 3. Cholelithiasis. 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 5. Aortic atherosclerosis. Aortic Atherosclerosis (ICD10-I70.0). Electronically Signed   By: Aram Candela M.D.   On: 11/26/2022 03:00   DG Chest 2 View  Result Date: 11/25/2022 CLINICAL DATA:  Chest pain EXAM: CHEST - 2 VIEW COMPARISON:  Chest x-ray 05/21/2008 FINDINGS: There are minimal patchy airspace opacities in the inferior  left upper lobe. There is no pleural effusion or pneumothorax. The cardiomediastinal silhouette is within normal limits. No acute fractures are seen. IMPRESSION: Minimal patchy airspace opacities in the inferior left upper lobe, which could be infectious or inflammatory. Follow-up PA and lateral chest x-ray recommended in 4-6 weeks to confirm resolution. Electronically Signed   By: Darliss Cheney M.D.   On: 11/25/2022 23:12    Procedures Procedures    Medications Ordered in ED Medications  sodium chloride flush (NS) 0.9 % injection 3 mL (has no administration in time range)  acetaminophen (TYLENOL) tablet 1,000 mg (has no administration in time range)  aspirin chewable tablet 81 mg (has no administration in time range)  ezetimibe (ZETIA) tablet 10 mg (has no administration in time range)  insulin aspart (novoLOG) injection 0-6 Units (has no administration in time range)  iohexol (OMNIPAQUE) 350 MG/ML injection 75 mL (75 mLs Intravenous Contrast Given  11/26/22 0104)    ED Course/ Medical Decision Making/ A&P                                 Medical Decision Making Amount and/or Complexity of Data Reviewed Labs: ordered. Radiology: ordered.  Risk Prescription drug management. Decision regarding hospitalization.   Patient with history of hypertension, diabetes, hyperlipidemia here for evaluation of chest pain.  EKG with evidence of prior inferior infarct, similar when compared to priors.  Troponins are negative x 2.  Chest x-ray with possible infiltrate.  Given his recent surgeries, lower extremity edema and a CTA PE study was obtained.  CTA is negative for PE as well as negative for pneumonia.  Patient heart score is 6, he has not had a prior cardiac evaluation.  Given this recommend observation admission for further cardiac workup.  Patient updated findings of studies and he is in agreement with admission.  Hospitalist consulted for admission.        Final Clinical Impression(s) / ED Diagnoses Final diagnoses:  Precordial pain    Rx / DC Orders ED Discharge Orders     None         Tilden Fossa, MD 11/26/22 [id number]

## 2022-11-27 ENCOUNTER — Other Ambulatory Visit (HOSPITAL_COMMUNITY): Payer: Self-pay

## 2022-11-27 DIAGNOSIS — I2510 Atherosclerotic heart disease of native coronary artery without angina pectoris: Secondary | ICD-10-CM | POA: Insufficient documentation

## 2022-11-27 DIAGNOSIS — R079 Chest pain, unspecified: Secondary | ICD-10-CM

## 2022-11-27 DIAGNOSIS — E785 Hyperlipidemia, unspecified: Secondary | ICD-10-CM | POA: Diagnosis not present

## 2022-11-27 DIAGNOSIS — R072 Precordial pain: Secondary | ICD-10-CM | POA: Diagnosis not present

## 2022-11-27 MED ORDER — ASPIRIN 81 MG PO CHEW: 81.0000 mg | CHEWABLE_TABLET | Freq: Every day | ORAL | 0 refills | Status: DC

## 2022-11-27 MED ORDER — METOPROLOL SUCCINATE ER 50 MG PO TB24
50.0000 mg | ORAL_TABLET | Freq: Every day | ORAL | 2 refills | Status: AC
  Filled 2022-11-27: qty 30, 30d supply, fill #0

## 2022-11-27 MED ORDER — ASPIRIN 81 MG PO CHEW
81.0000 mg | CHEWABLE_TABLET | Freq: Every day | ORAL | 0 refills | Status: AC
  Filled 2022-11-27: qty 90, 90d supply, fill #0

## 2022-11-27 MED ORDER — METOPROLOL SUCCINATE ER 50 MG PO TB24: 50.0000 mg | ORAL_TABLET | Freq: Every day | ORAL | 2 refills | Status: DC

## 2022-11-27 MED ORDER — EZETIMIBE 10 MG PO TABS
10.0000 mg | ORAL_TABLET | Freq: Every day | ORAL | 2 refills | Status: AC
  Filled 2022-11-27: qty 30, 30d supply, fill #0

## 2022-11-27 MED ORDER — EZETIMIBE 10 MG PO TABS: 10.0000 mg | ORAL_TABLET | Freq: Every day | ORAL | 2 refills | Status: DC

## 2022-11-27 NOTE — Plan of Care (Signed)
  Problem: Health Behavior/Discharge Planning: Goal: Ability to manage health-related needs will improve Outcome: Progressing   Problem: Clinical Measurements: Goal: Ability to maintain clinical measurements within normal limits will improve Outcome: Progressing Goal: Will remain free from infection Outcome: Progressing Goal: Diagnostic test results will improve Outcome: Progressing Goal: Respiratory complications will improve Outcome: Progressing Goal: Cardiovascular complication will be avoided Outcome: Progressing   Problem: Activity: Goal: Risk for activity intolerance will decrease Outcome: Progressing   Problem: Nutrition: Goal: Adequate nutrition will be maintained Outcome: Progressing   Problem: Elimination: Goal: Will not experience complications related to bowel motility Outcome: Progressing Goal: Will not experience complications related to urinary retention Outcome: Progressing   Problem: Safety: Goal: Ability to remain free from injury will improve Outcome: Progressing   Problem: Skin Integrity: Goal: Risk for impaired skin integrity will decrease Outcome: Progressing   

## 2022-11-27 NOTE — Discharge Summary (Signed)
metFORMIN 750 MG 24 hr tablet Commonly known as: GLUCOPHAGE-XR Take 750 mg by mouth 2 (two) times daily.    metoprolol succinate 50 MG 24 hr tablet Commonly known as: Toprol XL Take 1 tablet (50 mg total) by mouth daily. Take with or immediately following a meal.   oxybutynin 5 MG tablet Commonly known as: DITROPAN Take 5 mg by mouth at bedtime.   potassium citrate 5 MEQ (540 MG) SR tablet Commonly known as: UROCIT-K Take 15 mEq by mouth 2 (two) times daily.   valsartan 80 MG tablet Commonly known as: DIOVAN Take 80 mg by mouth daily.        Follow-up Information     Marcelino Duster, PA Follow up on 12/10/2022.   Specialties: Cardiology, Radiology Why: at 10am for your cardiology follow up appoinement Contact information: [address] [place] [address] [phone]         Gus Height, [place]. Schedule an appointment as soon as possible for a visit in 1 week(s).   Specialty: Family Medicine Why: For hospital follow-up Contact information: [address] [place] [address] [phone]                Discharge Exam: BP (!) 142/77 (BP Location: Left Arm)   Pulse 77   Temp 98.3 F (36.8 C) (Oral)   Resp 12   Ht 5\' 8"  (1.727 m)   Wt 99.1 kg   SpO2 97%   BMI 33.21 kg/m   General exam: Appears calm and comfortable Respiratory system: Clear to auscultation. Respiratory effort normal. Cardiovascular system: S1 & S2 heard, RRR. No murmurs, rubs, gallops or clicks. Gastrointestinal system: Abdomen is nondistended, soft and nontender.  Normal bowel sounds heard. Central nervous system: Alert and oriented. No focal neurological deficits. Musculoskeletal: No edema. No calf tenderness Skin: No cyanosis. No rashes Psychiatry: Judgement and insight appear normal. Mood & affect appropriate.   Condition at discharge: stable  The results of significant diagnostics from this hospitalization (including imaging, microbiology, ancillary and laboratory) are listed below for reference.   Imaging Studies: ECHOCARDIOGRAM COMPLETE  Result Date:  11/26/2022    ECHOCARDIOGRAM REPORT   Patient Name:   Adam Mata Date of Exam: 11/26/2022 Medical Rec #:  [id number]      Height:       68.0 in Accession #:    [id number]     Weight:       218.4 lb Date of Birth:  December 02, 1940     BSA:          2.122 m Patient Age:    82 years       BP:           122/81 mmHg Patient Gender: M              HR:           61 bpm. Exam Location:  Inpatient Procedure: 2D Echo, Cardiac Doppler and Color Doppler Indications:    Chest pain  History:        Patient has no prior history of Echocardiogram examinations.                 Risk Factors:Diabetes, Hypertension and Dyslipidemia. CKD.  Sonographer:    Milda Smart Referring Phys: [phone] Valerie Fredin A Kayveon Lennartz  Sonographer Comments: Suboptimal subcostal window. Image acquisition challenging due to patient body habitus. IMPRESSIONS  1. Left ventricular ejection fraction, by estimation, is 60 to 65%. The left ventricle has normal function.  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 *  Hospital Course: Zavian Slowey is a 82 y.o. male with a history of hypertension, hyperlipidemia, diabetes mellitus type 2, thrombocytopenia, recurrent kidney stones.  Patient presented secondary to substernal chest pain with no associated dyspnea, diaphoresis, nausea, vomiting.  Patient called EMS who gave him aspirin 324 mg in addition to sublingual nitroglycerin x 2 with improvement of symptoms.  Initial workup was negative for acute PE.  Troponin was negative.  EKG abnormal with nonspecific changes concern for possible prior coronary disease cardiologist consulted and performed a cardiac CTA which was significant for nonobstructive CAD.  Echo was significant for normal LVEF and no regional wall motion abnormalities. Cardiology for discharge with metoprolol succinate and aspirin; no inpatient ischemic workup.  Assessment and Plan:  Chest pain Some typical and atypical features. Possibly unstable angina. Symptoms relieved with nitroglycerin and are now resolved. Troponin is negative. EKG with nonspecific EKG changes and evidence of possible prior ischemic disease. Family history of heart attack and stroke in mother and father.  Transthoracic echo significant for normal LVEF of 66 5% with no regional wall motion abnormalities and grade 1 diastolic dysfunction.  Cardiac CT was performed and was significant for nonobstructive  CAD.  Cardiology with recommendations for metoprolol succinate and aspirin on discharge and will follow-up with patient as an outpatient for consideration of cardiac catheterization.  CAD Noted on cardiac CTA. Nonobstructive. Aspirin and metoprolol started by cardiology.   Lower extremity edema Noted. BNP normal. Transthoracic Echocardiogram and shows only diastolic heart dysfunction.   Thrombocytopenia Patient previously followed by hematology/oncology. 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 Stable.   Primary hypertension Continue valsartan. Cardiology started metoprolol on discharge.  Hyperlipidemia Cardiology started Zetia. Continued on discharge.  Diabetes Continue metformin.  Thrombocytopenia No changes to home medications   Consultants: Cardiology Procedures performed: Transthoracic Echocardiogram   Disposition: Home Diet recommendation: Carb modified diet   DISCHARGE MEDICATION: Allergies as of 11/27/2022       Reactions   Fluvastatin Other (See Comments)   Cramps   Lovastatin Other (See Comments)   Cramps   Meloxicam Other (See Comments)    Elevated blood pressure   Pravastatin Other (See Comments)   Cramps   Simvastatin Other (See Comments)   Cramps   Tamsulosin Other (See Comments)   Not sure reaction        Medication List     STOP taking these medications    meloxicam 15 MG tablet Commonly known as: MOBIC       TAKE these medications    aspirin 81 MG chewable tablet Chew 1 tablet (81 mg total) by mouth daily. Start taking on: November 28, 2022   ezetimibe 10 MG tablet Commonly known as: ZETIA Take 1 tablet (10 mg total) by mouth daily. Start taking on: November 28, 2022   fluticasone 50 MCG/ACT nasal spray Commonly known as: FLONASE Place 2 sprays into both nostrils as needed for allergies.  metFORMIN 750 MG 24 hr tablet Commonly known as: GLUCOPHAGE-XR Take 750 mg by mouth 2 (two) times daily.    metoprolol succinate 50 MG 24 hr tablet Commonly known as: Toprol XL Take 1 tablet (50 mg total) by mouth daily. Take with or immediately following a meal.   oxybutynin 5 MG tablet Commonly known as: DITROPAN Take 5 mg by mouth at bedtime.   potassium citrate 5 MEQ (540 MG) SR tablet Commonly known as: UROCIT-K Take 15 mEq by mouth 2 (two) times daily.   valsartan 80 MG tablet Commonly known as: DIOVAN Take 80 mg by mouth daily.        Follow-up Information     Marcelino Duster, PA Follow up on 12/10/2022.   Specialties: Cardiology, Radiology Why: at 10am for your cardiology follow up appoinement Contact information: [address] [place] [address] [phone]         Gus Height, [place]. Schedule an appointment as soon as possible for a visit in 1 week(s).   Specialty: Family Medicine Why: For hospital follow-up Contact information: [address] [place] [address] [phone]                Discharge Exam: BP (!) 142/77 (BP Location: Left Arm)   Pulse 77   Temp 98.3 F (36.8 C) (Oral)   Resp 12   Ht 5\' 8"  (1.727 m)   Wt 99.1 kg   SpO2 97%   BMI 33.21 kg/m   General exam: Appears calm and comfortable Respiratory system: Clear to auscultation. Respiratory effort normal. Cardiovascular system: S1 & S2 heard, RRR. No murmurs, rubs, gallops or clicks. Gastrointestinal system: Abdomen is nondistended, soft and nontender.  Normal bowel sounds heard. Central nervous system: Alert and oriented. No focal neurological deficits. Musculoskeletal: No edema. No calf tenderness Skin: No cyanosis. No rashes Psychiatry: Judgement and insight appear normal. Mood & affect appropriate.   Condition at discharge: stable  The results of significant diagnostics from this hospitalization (including imaging, microbiology, ancillary and laboratory) are listed below for reference.   Imaging Studies: ECHOCARDIOGRAM COMPLETE  Result Date:  11/26/2022    ECHOCARDIOGRAM REPORT   Patient Name:   Adam Mata Date of Exam: 11/26/2022 Medical Rec #:  [id number]      Height:       68.0 in Accession #:    [id number]     Weight:       218.4 lb Date of Birth:  December 02, 1940     BSA:          2.122 m Patient Age:    82 years       BP:           122/81 mmHg Patient Gender: M              HR:           61 bpm. Exam Location:  Inpatient Procedure: 2D Echo, Cardiac Doppler and Color Doppler Indications:    Chest pain  History:        Patient has no prior history of Echocardiogram examinations.                 Risk Factors:Diabetes, Hypertension and Dyslipidemia. CKD.  Sonographer:    Milda Smart Referring Phys: [phone] Valerie Fredin A Kayveon Lennartz  Sonographer Comments: Suboptimal subcostal window. Image acquisition challenging due to patient body habitus. IMPRESSIONS  1. Left ventricular ejection fraction, by estimation, is 60 to 65%. The left ventricle has normal function.  metFORMIN 750 MG 24 hr tablet Commonly known as: GLUCOPHAGE-XR Take 750 mg by mouth 2 (two) times daily.    metoprolol succinate 50 MG 24 hr tablet Commonly known as: Toprol XL Take 1 tablet (50 mg total) by mouth daily. Take with or immediately following a meal.   oxybutynin 5 MG tablet Commonly known as: DITROPAN Take 5 mg by mouth at bedtime.   potassium citrate 5 MEQ (540 MG) SR tablet Commonly known as: UROCIT-K Take 15 mEq by mouth 2 (two) times daily.   valsartan 80 MG tablet Commonly known as: DIOVAN Take 80 mg by mouth daily.        Follow-up Information     Marcelino Duster, PA Follow up on 12/10/2022.   Specialties: Cardiology, Radiology Why: at 10am for your cardiology follow up appoinement Contact information: [address] [place] [address] [phone]         Gus Height, [place]. Schedule an appointment as soon as possible for a visit in 1 week(s).   Specialty: Family Medicine Why: For hospital follow-up Contact information: [address] [place] [address] [phone]                Discharge Exam: BP (!) 142/77 (BP Location: Left Arm)   Pulse 77   Temp 98.3 F (36.8 C) (Oral)   Resp 12   Ht 5\' 8"  (1.727 m)   Wt 99.1 kg   SpO2 97%   BMI 33.21 kg/m   General exam: Appears calm and comfortable Respiratory system: Clear to auscultation. Respiratory effort normal. Cardiovascular system: S1 & S2 heard, RRR. No murmurs, rubs, gallops or clicks. Gastrointestinal system: Abdomen is nondistended, soft and nontender.  Normal bowel sounds heard. Central nervous system: Alert and oriented. No focal neurological deficits. Musculoskeletal: No edema. No calf tenderness Skin: No cyanosis. No rashes Psychiatry: Judgement and insight appear normal. Mood & affect appropriate.   Condition at discharge: stable  The results of significant diagnostics from this hospitalization (including imaging, microbiology, ancillary and laboratory) are listed below for reference.   Imaging Studies: ECHOCARDIOGRAM COMPLETE  Result Date:  11/26/2022    ECHOCARDIOGRAM REPORT   Patient Name:   Adam Mata Date of Exam: 11/26/2022 Medical Rec #:  [id number]      Height:       68.0 in Accession #:    [id number]     Weight:       218.4 lb Date of Birth:  December 02, 1940     BSA:          2.122 m Patient Age:    82 years       BP:           122/81 mmHg Patient Gender: M              HR:           61 bpm. Exam Location:  Inpatient Procedure: 2D Echo, Cardiac Doppler and Color Doppler Indications:    Chest pain  History:        Patient has no prior history of Echocardiogram examinations.                 Risk Factors:Diabetes, Hypertension and Dyslipidemia. CKD.  Sonographer:    Milda Smart Referring Phys: [phone] Valerie Fredin A Kayveon Lennartz  Sonographer Comments: Suboptimal subcostal window. Image acquisition challenging due to patient body habitus. IMPRESSIONS  1. Left ventricular ejection fraction, by estimation, is 60 to 65%. The left ventricle has normal function.  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 *  Hospital Course: Zavian Slowey is a 82 y.o. male with a history of hypertension, hyperlipidemia, diabetes mellitus type 2, thrombocytopenia, recurrent kidney stones.  Patient presented secondary to substernal chest pain with no associated dyspnea, diaphoresis, nausea, vomiting.  Patient called EMS who gave him aspirin 324 mg in addition to sublingual nitroglycerin x 2 with improvement of symptoms.  Initial workup was negative for acute PE.  Troponin was negative.  EKG abnormal with nonspecific changes concern for possible prior coronary disease cardiologist consulted and performed a cardiac CTA which was significant for nonobstructive CAD.  Echo was significant for normal LVEF and no regional wall motion abnormalities. Cardiology for discharge with metoprolol succinate and aspirin; no inpatient ischemic workup.  Assessment and Plan:  Chest pain Some typical and atypical features. Possibly unstable angina. Symptoms relieved with nitroglycerin and are now resolved. Troponin is negative. EKG with nonspecific EKG changes and evidence of possible prior ischemic disease. Family history of heart attack and stroke in mother and father.  Transthoracic echo significant for normal LVEF of 66 5% with no regional wall motion abnormalities and grade 1 diastolic dysfunction.  Cardiac CT was performed and was significant for nonobstructive  CAD.  Cardiology with recommendations for metoprolol succinate and aspirin on discharge and will follow-up with patient as an outpatient for consideration of cardiac catheterization.  CAD Noted on cardiac CTA. Nonobstructive. Aspirin and metoprolol started by cardiology.   Lower extremity edema Noted. BNP normal. Transthoracic Echocardiogram and shows only diastolic heart dysfunction.   Thrombocytopenia Patient previously followed by hematology/oncology. 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 Stable.   Primary hypertension Continue valsartan. Cardiology started metoprolol on discharge.  Hyperlipidemia Cardiology started Zetia. Continued on discharge.  Diabetes Continue metformin.  Thrombocytopenia No changes to home medications   Consultants: Cardiology Procedures performed: Transthoracic Echocardiogram   Disposition: Home Diet recommendation: Carb modified diet   DISCHARGE MEDICATION: Allergies as of 11/27/2022       Reactions   Fluvastatin Other (See Comments)   Cramps   Lovastatin Other (See Comments)   Cramps   Meloxicam Other (See Comments)    Elevated blood pressure   Pravastatin Other (See Comments)   Cramps   Simvastatin Other (See Comments)   Cramps   Tamsulosin Other (See Comments)   Not sure reaction        Medication List     STOP taking these medications    meloxicam 15 MG tablet Commonly known as: MOBIC       TAKE these medications    aspirin 81 MG chewable tablet Chew 1 tablet (81 mg total) by mouth daily. Start taking on: November 28, 2022   ezetimibe 10 MG tablet Commonly known as: ZETIA Take 1 tablet (10 mg total) by mouth daily. Start taking on: November 28, 2022   fluticasone 50 MCG/ACT nasal spray Commonly known as: FLONASE Place 2 sprays into both nostrils as needed for allergies.  metFORMIN 750 MG 24 hr tablet Commonly known as: GLUCOPHAGE-XR Take 750 mg by mouth 2 (two) times daily.    metoprolol succinate 50 MG 24 hr tablet Commonly known as: Toprol XL Take 1 tablet (50 mg total) by mouth daily. Take with or immediately following a meal.   oxybutynin 5 MG tablet Commonly known as: DITROPAN Take 5 mg by mouth at bedtime.   potassium citrate 5 MEQ (540 MG) SR tablet Commonly known as: UROCIT-K Take 15 mEq by mouth 2 (two) times daily.   valsartan 80 MG tablet Commonly known as: DIOVAN Take 80 mg by mouth daily.        Follow-up Information     Marcelino Duster, PA Follow up on 12/10/2022.   Specialties: Cardiology, Radiology Why: at 10am for your cardiology follow up appoinement Contact information: [address] [place] [address] [phone]         Gus Height, [place]. Schedule an appointment as soon as possible for a visit in 1 week(s).   Specialty: Family Medicine Why: For hospital follow-up Contact information: [address] [place] [address] [phone]                Discharge Exam: BP (!) 142/77 (BP Location: Left Arm)   Pulse 77   Temp 98.3 F (36.8 C) (Oral)   Resp 12   Ht 5\' 8"  (1.727 m)   Wt 99.1 kg   SpO2 97%   BMI 33.21 kg/m   General exam: Appears calm and comfortable Respiratory system: Clear to auscultation. Respiratory effort normal. Cardiovascular system: S1 & S2 heard, RRR. No murmurs, rubs, gallops or clicks. Gastrointestinal system: Abdomen is nondistended, soft and nontender.  Normal bowel sounds heard. Central nervous system: Alert and oriented. No focal neurological deficits. Musculoskeletal: No edema. No calf tenderness Skin: No cyanosis. No rashes Psychiatry: Judgement and insight appear normal. Mood & affect appropriate.   Condition at discharge: stable  The results of significant diagnostics from this hospitalization (including imaging, microbiology, ancillary and laboratory) are listed below for reference.   Imaging Studies: ECHOCARDIOGRAM COMPLETE  Result Date:  11/26/2022    ECHOCARDIOGRAM REPORT   Patient Name:   Adam Mata Date of Exam: 11/26/2022 Medical Rec #:  [id number]      Height:       68.0 in Accession #:    [id number]     Weight:       218.4 lb Date of Birth:  December 02, 1940     BSA:          2.122 m Patient Age:    82 years       BP:           122/81 mmHg Patient Gender: M              HR:           61 bpm. Exam Location:  Inpatient Procedure: 2D Echo, Cardiac Doppler and Color Doppler Indications:    Chest pain  History:        Patient has no prior history of Echocardiogram examinations.                 Risk Factors:Diabetes, Hypertension and Dyslipidemia. CKD.  Sonographer:    Milda Smart Referring Phys: [phone] Valerie Fredin A Kayveon Lennartz  Sonographer Comments: Suboptimal subcostal window. Image acquisition challenging due to patient body habitus. IMPRESSIONS  1. Left ventricular ejection fraction, by estimation, is 60 to 65%. The left ventricle has normal function.

## 2022-11-27 NOTE — Care Management Obs Status (Signed)
MEDICARE OBSERVATION STATUS NOTIFICATION   Patient Details  Name: Adam Mata MRN: [id number] Date of Birth: [birth year]   Medicare Observation Status Notification Given:  Yes    Ronny Bacon, RN 11/27/2022, 7:53 AM

## 2022-11-27 NOTE — Progress Notes (Signed)
Rounding Note    Patient Name: Jaffer Dinsdale Date of Encounter: 11/27/2022  [facility] Cardiologist: None   Subjective   Feeling well without chest pain  Inpatient Medications    Scheduled Meds:  aspirin  81 mg Oral Daily   ezetimibe  10 mg Oral Daily   influenza vaccine adjuvanted  0.5 mL Intramuscular Tomorrow-1000   insulin aspart  0-6 Units Subcutaneous TID WC   sodium chloride flush  3 mL Intravenous Q12H   Continuous Infusions:  PRN Meds: acetaminophen   Vital Signs    Vitals:   11/26/22 1449 11/26/22 1920 11/26/22 1937 11/27/22 0609  BP: 122/81 130/66  (!) 142/77  Pulse: 75 63 66 77  Resp: 14 15 19 12   Temp: 98.2 F (36.8 C) 98.1 F (36.7 C)  98.3 F (36.8 C)  TempSrc: Oral Oral  Oral  SpO2: 95% 96% 97% 97%  Weight:      Height:        Intake/Output Summary (Last 24 hours) at 11/27/2022 0905 Last data filed at 11/26/2022 2300 Gross per 24 hour  Intake --  Output 1200 ml  Net -1200 ml      11/26/2022    8:59 AM 11/23/2022    7:16 AM 11/16/2022    2:12 PM  Last 3 Weights  Weight (lbs) 218 lb 6.4 oz 217 lb 9.6 oz 217 lb  Weight (kg) 99.066 kg 98.703 kg 98.431 kg      Telemetry    Sinus rhythm - Personally Reviewed  ECG    Sinus rhythm - Personally Reviewed  Physical Exam   GEN: No acute distress.   Neck: No JVD Cardiac: RRR, no murmurs, rubs, or gallops.  Respiratory: Clear to auscultation bilaterally. GI: Soft, nontender, non-distended  MS: No edema; No deformity. Neuro:  Nonfocal  Psych: Normal affect   Labs    High Sensitivity Troponin:   Recent Labs  Lab 11/25/22 2108 11/25/22 2319  TROPONINIHS 5 2  5      Chemistry Recent Labs  Lab 11/23/22 0801 11/25/22 2108 11/25/22 2319  NA 141 142  --   K 3.9 4.1  --   CL 108 104  --   CO2  --  26  --   GLUCOSE 119* 152*  --   BUN 22 16  --   CREATININE 1.50* 1.29*  --   CALCIUM  --  9.2  --   PROT  --   --  6.5  ALBUMIN  --   --  3.6  AST  --   --  81*   ALT  --   --  35  ALKPHOS  --   --  77  BILITOT  --   --  0.8  GFRNONAA  --  56*  --   ANIONGAP  --  12  --     Lipids  Recent Labs  Lab 11/25/22 2108  CHOL 151  TRIG 208*  HDL 32*  LDLCALC 77  CHOLHDL 4.7    Hematology Recent Labs  Lab 11/23/22 0801 11/25/22 2108  WBC  --  4.5  RBC  --  3.67*  HGB 11.2* 11.1*  HCT 33.0* 34.8*  MCV  --  94.8  MCH  --  30.2  MCHC  --  31.9  RDW  --  13.2  PLT  --  83*   Thyroid  Recent Labs  Lab 11/25/22 2319  TSH 2.541    BNP Recent Labs  Lab 11/25/22 2108  BNP 62.0    DDimer No results for input(s): "DDIMER" in the last 168 hours.   Radiology    ECHOCARDIOGRAM COMPLETE  Result Date: 11/26/2022    ECHOCARDIOGRAM REPORT   Patient Name:   SHAUGHN BJORKLUND Date of Exam: 11/26/2022 Medical Rec #:  [id number]      Height:       68.0 in Accession #:    [id number]     Weight:       218.4 lb Date of Birth:  14-Jul-1940     BSA:          2.122 m Patient Age:    81 years       BP:           122/81 mmHg Patient Gender: M              HR:           61 bpm. Exam Location:  Inpatient Procedure: 2D Echo, Cardiac Doppler and Color Doppler Indications:    Chest pain  History:        Patient has no prior history of Echocardiogram examinations.                 Risk Factors:Diabetes, Hypertension and Dyslipidemia. CKD.  Sonographer:    Milda Smart Referring Phys: [phone] RALPH A NETTEY  Sonographer Comments: Suboptimal subcostal window. Image acquisition challenging due to patient body habitus. IMPRESSIONS  1. Left ventricular ejection fraction, by estimation, is 60 to 65%. The left ventricle has normal function. The left ventricle has no regional wall motion abnormalities. There is moderate concentric left ventricular hypertrophy. Left ventricular diastolic parameters are consistent with Grade I diastolic dysfunction (impaired relaxation).  2. Right ventricular systolic function is normal. The right ventricular size is normal. There is normal pulmonary  artery systolic pressure.  3. The mitral valve is normal in structure. Trivial mitral valve regurgitation. No evidence of mitral stenosis.  4. The aortic valve is tricuspid. There is mild calcification of the aortic valve. There is mild thickening of the aortic valve. Aortic valve regurgitation is not visualized. No aortic stenosis is present.  5. The inferior vena cava is normal in size with greater than 50% respiratory variability, suggesting right atrial pressure of 3 mmHg. FINDINGS  Left Ventricle: Left ventricular ejection fraction, by estimation, is 60 to 65%. The left ventricle has normal function. The left ventricle has no regional wall motion abnormalities. The left ventricular internal cavity size was normal in size. There is  moderate concentric left ventricular hypertrophy. Left ventricular diastolic parameters are consistent with Grade I diastolic dysfunction (impaired relaxation). Indeterminate filling pressures. Right Ventricle: The right ventricular size is normal. No increase in right ventricular wall thickness. Right ventricular systolic function is normal. There is normal pulmonary artery systolic pressure. The tricuspid regurgitant velocity is 2.22 m/s, and  with an assumed right atrial pressure of 3 mmHg, the estimated right ventricular systolic pressure is 22.7 mmHg. Left Atrium: Left atrial size was normal in size. Right Atrium: Right atrial size was normal in size. Pericardium: There is no evidence of pericardial effusion. Mitral Valve: The mitral valve is normal in structure. Trivial mitral valve regurgitation. No evidence of mitral valve stenosis. MV peak gradient, 4.2 mmHg. The mean mitral valve gradient is 2.0 mmHg. Tricuspid Valve: The tricuspid valve is normal in structure. Tricuspid valve regurgitation is trivial. No evidence of tricuspid stenosis. Aortic Valve: The aortic valve is tricuspid. There is mild calcification of the aortic valve. There is  mild thickening of the aortic valve.  Aortic valve regurgitation is not visualized. No aortic stenosis is present. Pulmonic Valve: The pulmonic valve was normal in structure. Pulmonic valve regurgitation is trivial. No evidence of pulmonic stenosis. Aorta: The aortic root is normal in size and structure. Venous: The inferior vena cava is normal in size with greater than 50% respiratory variability, suggesting right atrial pressure of 3 mmHg. IAS/Shunts: No atrial level shunt detected by color flow Doppler.  LEFT VENTRICLE PLAX 2D LVIDd:         3.90 cm   Diastology LVIDs:         2.80 cm   LV e' medial:    5.22 cm/s LV PW:         1.30 cm   LV E/e' medial:  15.0 LV IVS:        1.40 cm   LV e' lateral:   6.96 cm/s LVOT diam:     2.20 cm   LV E/e' lateral: 11.3 LVOT Area:     3.80 cm  RIGHT VENTRICLE RV Basal diam:  3.10 cm RV S prime:     13.10 cm/s LEFT ATRIUM             Index        RIGHT ATRIUM          Index LA diam:        3.30 cm 1.56 cm/m   RA Area:     9.16 cm LA Vol (A2C):   37.6 ml 17.72 ml/m  RA Volume:   17.10 ml 8.06 ml/m LA Vol (A4C):   28.4 ml 13.39 ml/m LA Biplane Vol: 33.3 ml 15.70 ml/m   AORTA Ao Root diam: 3.20 cm Ao Asc diam:  3.10 cm MITRAL VALVE                TRICUSPID VALVE MV Area (PHT): 2.28 cm     TR Peak grad:   19.7 mmHg MV Peak grad:  4.2 mmHg     TR Vmax:        222.00 cm/s MV Mean grad:  2.0 mmHg MV Vmax:       1.02 m/s     SHUNTS MV Vmean:      66.7 cm/s    Systemic Diam: 2.20 cm MV Decel Time: 333 msec MV E velocity: 78.50 cm/s MV A velocity: 105.00 cm/s MV E/A ratio:  0.75 Chilton Si MD Electronically signed by Chilton Si MD Signature Date/Time: 11/26/2022/7:16:20 PM    Final    CT CORONARY FRACTIONAL FLOW RESERVE FLUID ANALYSIS  Result Date: 11/26/2022 EXAM: FFRCT ANALYSIS FINDINGS: FFRct analysis was performed on the original cardiac CT angiogram dataset. Diagrammatic representation of the FFRct analysis is provided in a separate PDF document in PACS. This dictation was created using the PDF  document and an interactive 3D model of the results. 3D model is not available in the EMR/PACS. Normal FFR range is >0.80. 1. Left Main: No stenosis. LM FF = 0.98. 2. LAD: Borderline. Proximal FFR = 0.93, Mid FFR = 0.87, Distal FFR = 0.78. 3. LCX: No stenosis. Proximal FFR = 0.97, Mid FFR = 0.95. Distal vessel not modeled. 4. Ramus: Not modeled due to small caliber. 5. RCA: No stenosis. Proximal FFR = 0.99, Mid FFR = 0.95, Distal FFR = 0.83. IMPRESSION: 1. Coronary CTA FFR analysis demonstrates possible borderline hemodynamically flow limiting lesion in the mid to distal LAD (FFR 0.93>0.87>0.78). This likely represents natural tapering of the distal  vessel or small vessel disease but coronary CTA images did show a moderate calcified plaque in the mid LAD. 2. Recommend aggressive anti-ischemic pharmacotherapy and cardiac catheterization if symptoms continue. Armanda Magic, MD Electronically Signed   By: Armanda Magic M.D.   On: 11/26/2022 17:49   CT CORONARY MORPH W/CTA COR W/SCORE W/CA W/CM &/OR WO/CM  Result Date: 11/26/2022 CLINICAL DATA:  Chest pain EXAM: Cardiac/Coronary CTA TECHNIQUE: A non-contrast, gated CT scan was obtained with axial slices of 3 mm through the heart for calcium scoring. Calcium scoring was performed using the Agatston method. A 120 kV prospective, gated, contrast cardiac scan was obtained. Gantry rotation speed was 250 msecs and collimation was 0.6 mm. Two sublingual nitroglycerin tablets (0.8 mg) were given. The 3D data set was reconstructed in 5% intervals of the 35-75% of the R-R cycle. Diastolic phases were analyzed on a dedicated workstation using MPR, MIP, and VRT modes. The patient received 95 cc of contrast. FINDINGS: Image quality: Excellent. Noise artifact is: Limited. Coronary Arteries:  Normal coronary origin.  Right dominance. Left main: The left main is a large caliber vessel with a normal take off from the left coronary cusp that triifurcates to form a left anterior  descending artery, ramus and a left circumflex artery. There is minimal calcified plaque in the ostial LM with associated stenosis of <25%. Left anterior descending artery: The LAD gives off 2 patent diagonal branches. There is mild soft plaque in the ostial LAD with associated stenosis of 25-49%. There is mild calcified plaque in the proximal and distal LAD with associated stenosis of at least 25-49%. There is moderate calcified plaque in the mid LAD with associated stenosis of 50-69%. Ramus: Very small vessel with no obvious plaque. Left circumflex artery: The LCX is non-dominant and gives off 2 patent obtuse marginal branches. There is mild calcified plaque in the proximal and mid LCx with associated stenosis of 25-49%. Right coronary artery: The RCA is dominant with normal take off from the right coronary cusp. There is mild calcified plaque throughout the RCA with associated stenosis of 25-49%. There is scattered mild to moderate Non calcified plaque throughout the proximal and mid RCA with associated stenosis of at least 25-49% but could be in some places as high as 50% or greater. The RCA terminates as a PDA and right posterolateral branch without evidence of plaque or stenosis. Right Atrium: Right atrial size is within normal limits. Right Ventricle: The right ventricular cavity is within normal limits. Left Atrium: Left atrial size is normal in size with no left atrial appendage filling defect. Left Ventricle: The ventricular cavity size is within normal limits. Pulmonary arteries: Normal in size. Pulmonary veins: Normal pulmonary venous drainage. Pericardium: Normal thickness without significant effusion or calcium present. Cardiac valves: The aortic valve is trileaflet without significant calcification. The mitral valve is normal without significant calcification. Aorta: Normal caliber without significant disease. Extra-cardiac findings: See attached radiology report for non-cardiac structures.  IMPRESSION: 1. Coronary calcium score of 685. This was 62nd percentile for age-, sex, and race-matched controls. 2. Total plaque volume 1148 mm3 which is 70th percentile for age- and sex-matched controls (calcified plaque 166mm3; non-calcified plaque 959mm3). TPV is extensive. 3. Normal coronary origin with right dominance. 4. Moderate atherosclerosis. 25-49% ostial/mid/distal LAD; 50-69% mid LAD; 25-49% prox/mid LCx; 25-49% diffuse RCA, possible >50% soft plaque prox/mid RCA. 5. Consider symptom-guided anti-ischemic and preventive pharmacotherapy as well as risk factor modification per guideline-directed care. 6. This study has been submitted for FFR  analysis. RECOMMENDATIONS: 1. CAD-RADS 0: No evidence of CAD (0%). Consider non-atherosclerotic causes of chest pain. 2. CAD-RADS 1: Minimal non-obstructive CAD (0-24%). Consider non-atherosclerotic causes of chest pain. Consider preventive therapy and risk factor modification. 3. CAD-RADS 2: Mild non-obstructive CAD (25-49%). Consider non-atherosclerotic causes of chest pain. Consider preventive therapy and risk factor modification. 4. CAD-RADS 3: Moderate stenosis. Consider symptom-guided anti-ischemic pharmacotherapy as well as risk factor modification per guideline directed care. Additional analysis with CT FFR Helga Asbury be submitted. 5. CAD-RADS 4: Severe stenosis. (70-99% or > 50% left main). Cardiac catheterization or CT FFR is recommended. Consider symptom-guided anti-ischemic pharmacotherapy as well as risk factor modification per guideline directed care. Invasive coronary angiography recommended with revascularization per published guideline statements. 6. CAD-RADS 5: Total coronary occlusion (100%). Consider cardiac catheterization or viability assessment. Consider symptom-guided anti-ischemic pharmacotherapy as well as risk factor modification per guideline directed care. 7. CAD-RADS N: Non-diagnostic study. Obstructive CAD can't be excluded. Alternative  evaluation is recommended. Armanda Magic, MD Electronically Signed   By: Armanda Magic M.D.   On: 11/26/2022 17:29   CT Angio Chest PE W/Cm &/Or Wo Cm  Result Date: 11/26/2022 CLINICAL DATA:  Chest pain. EXAM: CT ANGIOGRAPHY CHEST WITH CONTRAST TECHNIQUE: Multidetector CT imaging of the chest was performed using the standard protocol during bolus administration of intravenous contrast. Multiplanar CT image reconstructions and MIPs were obtained to evaluate the vascular anatomy. RADIATION DOSE REDUCTION: This exam was performed according to the departmental dose-optimization program which includes automated exposure control, adjustment of the mA and/or kV according to patient size and/or use of iterative reconstruction technique. CONTRAST:  75mL OMNIPAQUE IOHEXOL 350 MG/ML SOLN COMPARISON:  None Available. FINDINGS: Cardiovascular: There is mild calcification of the aortic arch, without evidence of aortic aneurysm. Satisfactory opacification of the pulmonary arteries to the segmental level. No evidence of pulmonary embolism. Normal heart size with moderate severity coronary artery calcification. No pericardial effusion. Mediastinum/Nodes: No enlarged mediastinal, hilar, or axillary lymph nodes. Thyroid gland, trachea, and esophagus demonstrate no significant findings. Lungs/Pleura: Mild right basilar linear scarring and/or atelectasis is seen with additional mild atelectatic changes noted throughout the periphery of the left lung. No acute infiltrate, pleural effusion or pneumothorax is identified. Upper Abdomen: Numerous subcentimeter gallstones are seen within the lumen of a mildly distended gallbladder. A 1.9 cm predominately fat density left adrenal mass is seen (approximately -62.60 Hounsfield units). Musculoskeletal: No chest wall abnormality. No acute or significant osseous findings. Review of the MIP images confirms the above findings. IMPRESSION: 1. No evidence of pulmonary embolism. 2. Mild right basilar  linear scarring and/or atelectasis. 3. Cholelithiasis. 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 5. Aortic atherosclerosis. Aortic Atherosclerosis (ICD10-I70.0). Electronically Signed   By: Aram Candela M.D.   On: 11/26/2022 03:00   DG Chest 2 View  Result Date: 11/25/2022 CLINICAL DATA:  Chest pain EXAM: CHEST - 2 VIEW COMPARISON:  Chest x-ray 05/21/2008 FINDINGS: There are minimal patchy airspace opacities in the inferior left upper lobe. There is no pleural effusion or pneumothorax. The cardiomediastinal silhouette is within normal limits. No acute fractures are seen. IMPRESSION: Minimal patchy airspace opacities in the inferior left upper lobe, which could be infectious or inflammatory. Follow-up PA and lateral chest x-ray recommended in 4-6 weeks to confirm resolution. Electronically Signed   By: Darliss Cheney M.D.   On: 11/25/2022 23:12    Cardiac Studies   TTE  1. Left ventricular ejection fraction, by estimation, is 60 to 65%. The  left ventricle has normal  function. The left ventricle has no regional  wall motion abnormalities. There is moderate concentric left ventricular  hypertrophy. Left ventricular  diastolic parameters are consistent with Grade I diastolic dysfunction  (impaired relaxation).   2. Right ventricular systolic function is normal. The right ventricular  size is normal. There is normal pulmonary artery systolic pressure.   3. The mitral valve is normal in structure. Trivial mitral valve  regurgitation. No evidence of mitral stenosis.   4. The aortic valve is tricuspid. There is mild calcification of the  aortic valve. There is mild thickening of the aortic valve. Aortic valve  regurgitation is not visualized. No aortic stenosis is present.   5. The inferior vena cava is normal in size with greater than 50%  respiratory variability, suggesting right atrial pressure of 3 mmHg.   Coronary CTA 1. Coronary CTA FFR analysis demonstrates possible  borderline hemodynamically flow limiting lesion in the mid to distal LAD (FFR 0.93>0.87>0.78). This likely represents natural tapering of the distal vessel or small vessel disease but coronary CTA images did show a moderate calcified plaque in the mid LAD.   2. Recommend aggressive anti-ischemic pharmacotherapy and cardiac catheterization if symptoms continue.  Patient Profile     82 y.o. male presented with chest pain, no prior cardiac history  Assessment & Plan    1.  Chest pain: Appears noncardiac in nature.  He had a negative troponin.  2.  Coronary artery disease: Found on coronary CTA.  Nonobstructive.  Would start Toprol-XL 50 mg daily and aspirin 81 mg daily.  3.  Hyperlipidemia: LDL 77.  Goal at 70.  He is intolerant to statins.  Triglycerides significantly elevated.  Plan to be addressed as an outpatient.  [facility] Lanice Folden sign off.   Medication Recommendations: Toprol-XL and aspirin as above Other recommendations (labs, testing, etc): None Follow up as an outpatient: Has been arranged  For questions or updates, please contact [facility] Please consult [email or web address] for contact info under        Signed, Morganne Haile Jorja Loa, MD  11/27/2022, 9:05 AM

## 2022-11-27 NOTE — Discharge Instructions (Signed)
Adam Mata,  You were in the hospital with chest pain. There was a concern this could have been heart related. Thankfully your workup does not show any dangerous heart issues at this time. Your chest pain resolved and has not returned. The cardiologist has recommended for you to follow-up with them as an outpatient, however. You have been started on metoprolol and aspirin by the cardiologist.

## 2022-11-27 NOTE — Hospital Course (Signed)
Adam Mata is a 82 y.o. male with a history of hypertension, hyperlipidemia, diabetes mellitus type 2, thrombocytopenia, recurrent kidney stones.  Patient presented secondary to substernal chest pain with no associated dyspnea, diaphoresis, nausea, vomiting.  Patient called EMS who gave him aspirin 324 mg in addition to sublingual nitroglycerin x 2 with improvement of symptoms.  Initial workup was negative for acute PE.  Troponin was negative.  EKG abnormal with nonspecific changes concern for possible prior coronary disease cardiologist consulted and performed a cardiac CTA which was significant for nonobstructive CAD.  Echo was significant for normal LVEF and no regional wall motion abnormalities. Cardiology for discharge with metoprolol succinate and aspirin; no inpatient ischemic workup.

## 2022-11-29 LAB — GLUCOSE, CAPILLARY
Glucose-Capillary: 147 mg/dL — ABNORMAL HIGH (ref 70–99)
Glucose-Capillary: 120 mg/dL — ABNORMAL HIGH (ref 70–99)
Glucose-Capillary: 125 mg/dL — ABNORMAL HIGH (ref 70–99)

## 2022-11-30 DIAGNOSIS — Z09 Encounter for follow-up examination after completed treatment for conditions other than malignant neoplasm: Secondary | ICD-10-CM | POA: Diagnosis not present

## 2022-11-30 DIAGNOSIS — Z6832 Body mass index (BMI) 32.0-32.9, adult: Secondary | ICD-10-CM | POA: Diagnosis not present

## 2022-11-30 DIAGNOSIS — R079 Chest pain, unspecified: Secondary | ICD-10-CM | POA: Diagnosis not present

## 2022-11-30 DIAGNOSIS — I2510 Atherosclerotic heart disease of native coronary artery without angina pectoris: Secondary | ICD-10-CM | POA: Diagnosis not present

## 2022-11-30 DIAGNOSIS — Z79899 Other long term (current) drug therapy: Secondary | ICD-10-CM | POA: Diagnosis not present

## 2022-12-01 DIAGNOSIS — R3912 Poor urinary stream: Secondary | ICD-10-CM | POA: Diagnosis not present

## 2022-12-01 DIAGNOSIS — N401 Enlarged prostate with lower urinary tract symptoms: Secondary | ICD-10-CM | POA: Diagnosis not present

## 2022-12-01 DIAGNOSIS — R3121 Asymptomatic microscopic hematuria: Secondary | ICD-10-CM | POA: Diagnosis not present

## 2022-12-01 DIAGNOSIS — N200 Calculus of kidney: Secondary | ICD-10-CM | POA: Diagnosis not present

## 2022-12-08 NOTE — Progress Notes (Signed)
Cardiology Office Note:    Date:  12/10/2022   ID:  Adam Mata, DOB [birth year], MRN [id number]  PCP:  Gus Height, PA   [facility] Providers Cardiologist:  Thurmon Fair, MD Cardiology APP:  Marcelino Duster, PA     Referring MD: Gus Height, [place]   Chief Complaint  Patient presents with   Follow-up    Nonobstructive CAD by CCTA    History of Present Illness:    Adam Mata is a 82 y.o. 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  He was recently seen by cardiology during an ER visit 11/25/2022 for evaluation of chest pressure.  He underwent CT coronary 11/26/2022 that showed coronary calcium score of 685 placing him at the 62nd percentile.  He had extensive TPP.  FFR analysis demonstrates possible borderline hemodynamically flow-limiting lesion in the mid to distal LAD.  He was recommended for aggressive medical therapy and cardiac catheterization if symptoms persisted.  Echocardiogram was also obtained and showed preserved LVEF 60-65%, no RWMA, moderate LVH, grade 1 DD, no significant valvular disease.  His cardiac enzymes remained negative.  His LDL was 77, intolerant to statins he was started on Toprol 50 mg daily and aspirin 81 once daily for coronary CTA results.  He presents today for follow up. We spend much of the visit reviewing imaging / labs, symptoms, and setting up MyChart. He has failed three statins due to muscle pain and cramping. I also has DM with A1c 6.8%. We discuss PCSK9i and SGKT2i and he is amenable to both but only has insurance through the [place]. 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 He can still achieve 4.0 METS and I have asked him to try walking 5-10 min three times daily.   Past Medical History:  Diagnosis Date   CKD (chronic kidney disease), stage III [facility])    nephrologist--- dr Marisue Humble  [facility])   History of gout    per pt last episode many yrs ago   History of kidney stones    Hyperlipidemia    Hypertension    followed by pcp   MRSA infection 2020   Right ureteral calculus    Thrombocytopenia (HCC)    followed by dr Gus Height, platlet count runs low   Type 2 diabetes mellitus (HCC)    followed by pcp   (06-17-2020 pt stated does not check blood sugar at home)   Wears glasses     Past Surgical History:  Procedure Laterality Date   CARPAL TUNNEL RELEASE Right 07/25/2012   Procedure: CARPAL TUNNEL RELEASE;  Surgeon: Wyn Forster., MD;  Location: [facility];  Service: Orthopedics;  Laterality: Right;   CARPAL TUNNEL RELEASE Left 09/19/2012   Procedure: LEFT CARPAL TUNNEL RELEASE;  Surgeon: Wyn Forster., MD;  Location: [facility];  Service: Orthopedics;  Laterality: Left;   CATARACT EXTRACTION W/ INTRAOCULAR LENS IMPLANT Right 2017   COLONOSCOPY     CYSTOSCOPY/RETROGRADE/URETEROSCOPY/STONE EXTRACTION WITH BASKET Right 06/24/2020   Procedure: CYSTOSCOPY/bILATERAL Shan Levans /URETEROSCOPY/HOLMIUM LASER RIGHT  LITHOTRIPSY/RIGHT STONE EXTRACTION WITH BASKET/ STENT PLACEMENT;  Surgeon: [name], MD;  Location: [facility];  Service: Urology;  Laterality: Right;   CYSTOSCOPY/URETEROSCOPY/HOLMIUM LASER/STENT PLACEMENT  x3  last one 01-22-2020   CYSTOSCOPY/URETEROSCOPY/HOLMIUM LASER/STENT PLACEMENT Bilateral 10/26/2022   Procedure: CYSTOSCOPY BILATERAL RETROGRADE PYELOGRAM BILATERAL URETEROSCOPY/HOLMIUM LASER/STENT PLACEMENT;  Surgeon:  Cardiology Office Note:    Date:  12/10/2022   ID:  Adam Mata, DOB [birth year], MRN [id number]  PCP:  Gus Height, PA   [facility] Providers Cardiologist:  Thurmon Fair, MD Cardiology APP:  Marcelino Duster, PA     Referring MD: Gus Height, [place]   Chief Complaint  Patient presents with   Follow-up    Nonobstructive CAD by CCTA    History of Present Illness:    Adam Mata is a 82 y.o. 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  He was recently seen by cardiology during an ER visit 11/25/2022 for evaluation of chest pressure.  He underwent CT coronary 11/26/2022 that showed coronary calcium score of 685 placing him at the 62nd percentile.  He had extensive TPP.  FFR analysis demonstrates possible borderline hemodynamically flow-limiting lesion in the mid to distal LAD.  He was recommended for aggressive medical therapy and cardiac catheterization if symptoms persisted.  Echocardiogram was also obtained and showed preserved LVEF 60-65%, no RWMA, moderate LVH, grade 1 DD, no significant valvular disease.  His cardiac enzymes remained negative.  His LDL was 77, intolerant to statins he was started on Toprol 50 mg daily and aspirin 81 once daily for coronary CTA results.  He presents today for follow up. We spend much of the visit reviewing imaging / labs, symptoms, and setting up MyChart. He has failed three statins due to muscle pain and cramping. I also has DM with A1c 6.8%. We discuss PCSK9i and SGKT2i and he is amenable to both but only has insurance through the [place]. 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 He can still achieve 4.0 METS and I have asked him to try walking 5-10 min three times daily.   Past Medical History:  Diagnosis Date   CKD (chronic kidney disease), stage III [facility])    nephrologist--- dr Marisue Humble  [facility])   History of gout    per pt last episode many yrs ago   History of kidney stones    Hyperlipidemia    Hypertension    followed by pcp   MRSA infection 2020   Right ureteral calculus    Thrombocytopenia (HCC)    followed by dr Gus Height, platlet count runs low   Type 2 diabetes mellitus (HCC)    followed by pcp   (06-17-2020 pt stated does not check blood sugar at home)   Wears glasses     Past Surgical History:  Procedure Laterality Date   CARPAL TUNNEL RELEASE Right 07/25/2012   Procedure: CARPAL TUNNEL RELEASE;  Surgeon: Wyn Forster., MD;  Location: [facility];  Service: Orthopedics;  Laterality: Right;   CARPAL TUNNEL RELEASE Left 09/19/2012   Procedure: LEFT CARPAL TUNNEL RELEASE;  Surgeon: Wyn Forster., MD;  Location: [facility];  Service: Orthopedics;  Laterality: Left;   CATARACT EXTRACTION W/ INTRAOCULAR LENS IMPLANT Right 2017   COLONOSCOPY     CYSTOSCOPY/RETROGRADE/URETEROSCOPY/STONE EXTRACTION WITH BASKET Right 06/24/2020   Procedure: CYSTOSCOPY/bILATERAL Shan Levans /URETEROSCOPY/HOLMIUM LASER RIGHT  LITHOTRIPSY/RIGHT STONE EXTRACTION WITH BASKET/ STENT PLACEMENT;  Surgeon: [name], MD;  Location: [facility];  Service: Urology;  Laterality: Right;   CYSTOSCOPY/URETEROSCOPY/HOLMIUM LASER/STENT PLACEMENT  x3  last one 01-22-2020   CYSTOSCOPY/URETEROSCOPY/HOLMIUM LASER/STENT PLACEMENT Bilateral 10/26/2022   Procedure: CYSTOSCOPY BILATERAL RETROGRADE PYELOGRAM BILATERAL URETEROSCOPY/HOLMIUM LASER/STENT PLACEMENT;  Surgeon:  Cardiology Office Note:    Date:  12/10/2022   ID:  Adam Mata, DOB [birth year], MRN [id number]  PCP:  Gus Height, PA   [facility] Providers Cardiologist:  Thurmon Fair, MD Cardiology APP:  Marcelino Duster, PA     Referring MD: Gus Height, [place]   Chief Complaint  Patient presents with   Follow-up    Nonobstructive CAD by CCTA    History of Present Illness:    Adam Mata is a 82 y.o. 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  He was recently seen by cardiology during an ER visit 11/25/2022 for evaluation of chest pressure.  He underwent CT coronary 11/26/2022 that showed coronary calcium score of 685 placing him at the 62nd percentile.  He had extensive TPP.  FFR analysis demonstrates possible borderline hemodynamically flow-limiting lesion in the mid to distal LAD.  He was recommended for aggressive medical therapy and cardiac catheterization if symptoms persisted.  Echocardiogram was also obtained and showed preserved LVEF 60-65%, no RWMA, moderate LVH, grade 1 DD, no significant valvular disease.  His cardiac enzymes remained negative.  His LDL was 77, intolerant to statins he was started on Toprol 50 mg daily and aspirin 81 once daily for coronary CTA results.  He presents today for follow up. We spend much of the visit reviewing imaging / labs, symptoms, and setting up MyChart. He has failed three statins due to muscle pain and cramping. I also has DM with A1c 6.8%. We discuss PCSK9i and SGKT2i and he is amenable to both but only has insurance through the [place]. 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 He can still achieve 4.0 METS and I have asked him to try walking 5-10 min three times daily.   Past Medical History:  Diagnosis Date   CKD (chronic kidney disease), stage III [facility])    nephrologist--- dr Marisue Humble  [facility])   History of gout    per pt last episode many yrs ago   History of kidney stones    Hyperlipidemia    Hypertension    followed by pcp   MRSA infection 2020   Right ureteral calculus    Thrombocytopenia (HCC)    followed by dr Gus Height, platlet count runs low   Type 2 diabetes mellitus (HCC)    followed by pcp   (06-17-2020 pt stated does not check blood sugar at home)   Wears glasses     Past Surgical History:  Procedure Laterality Date   CARPAL TUNNEL RELEASE Right 07/25/2012   Procedure: CARPAL TUNNEL RELEASE;  Surgeon: Wyn Forster., MD;  Location: [facility];  Service: Orthopedics;  Laterality: Right;   CARPAL TUNNEL RELEASE Left 09/19/2012   Procedure: LEFT CARPAL TUNNEL RELEASE;  Surgeon: Wyn Forster., MD;  Location: [facility];  Service: Orthopedics;  Laterality: Left;   CATARACT EXTRACTION W/ INTRAOCULAR LENS IMPLANT Right 2017   COLONOSCOPY     CYSTOSCOPY/RETROGRADE/URETEROSCOPY/STONE EXTRACTION WITH BASKET Right 06/24/2020   Procedure: CYSTOSCOPY/bILATERAL Shan Levans /URETEROSCOPY/HOLMIUM LASER RIGHT  LITHOTRIPSY/RIGHT STONE EXTRACTION WITH BASKET/ STENT PLACEMENT;  Surgeon: [name], MD;  Location: [facility];  Service: Urology;  Laterality: Right;   CYSTOSCOPY/URETEROSCOPY/HOLMIUM LASER/STENT PLACEMENT  x3  last one 01-22-2020   CYSTOSCOPY/URETEROSCOPY/HOLMIUM LASER/STENT PLACEMENT Bilateral 10/26/2022   Procedure: CYSTOSCOPY BILATERAL RETROGRADE PYELOGRAM BILATERAL URETEROSCOPY/HOLMIUM LASER/STENT PLACEMENT;  Surgeon:  Patient Name:   CHILTON SALLADE Date of Exam: 11/26/2022 Medical Rec #:  [id number]      Height:       68.0 in Accession #:    [id number]     Weight:       218.4 lb Date of Birth:  03-08-1940     BSA:          2.122 m Patient Age:    81 years       BP:           122/81 mmHg Patient Gender: M              HR:           61 bpm. Exam Location:  Inpatient  Procedure: 2D Echo, Cardiac Doppler and Color Doppler  Indications:    Chest pain  History:        Patient has no prior history of Echocardiogram examinations. Risk Factors:Diabetes, Hypertension and Dyslipidemia. CKD.  Sonographer:    Milda Smart Referring Phys: [phone] RALPH A NETTEY   Sonographer Comments: Suboptimal subcostal window. Image acquisition challenging due to patient body habitus. IMPRESSIONS   1. Left ventricular ejection fraction, by estimation, is 60 to 65%. The left ventricle has normal function. The left ventricle has no regional wall motion abnormalities. There is  moderate concentric left ventricular hypertrophy. Left ventricular diastolic parameters are consistent with Grade I diastolic dysfunction (impaired relaxation). 2. Right ventricular systolic function is normal. The right ventricular size is normal. There is normal pulmonary artery systolic pressure. 3. The mitral valve is normal in structure. Trivial mitral valve regurgitation. No evidence of mitral stenosis. 4. The aortic valve is tricuspid. There is mild calcification of the aortic valve. There is mild thickening of the aortic valve. Aortic valve regurgitation is not visualized. No aortic stenosis is present. 5. The inferior vena cava is normal in size with greater than 50% respiratory variability, suggesting right atrial pressure of 3 mmHg.  FINDINGS Left Ventricle: Left ventricular ejection fraction, by estimation, is 60 to 65%. The left ventricle has normal function. The left ventricle has no regional wall motion abnormalities. The left ventricular internal cavity size was normal in size. There is moderate concentric left ventricular hypertrophy. Left ventricular diastolic parameters are consistent with Grade I diastolic dysfunction (impaired relaxation). Indeterminate filling pressures.  Right Ventricle: The right ventricular size is normal. No increase in right ventricular wall thickness. Right ventricular systolic function is normal. There is normal pulmonary artery systolic pressure. The tricuspid regurgitant velocity is 2.22 m/s, and with an assumed right atrial pressure of 3 mmHg, the estimated right ventricular systolic pressure is 22.7 mmHg.  Left Atrium: Left atrial size was normal in size.  Right Atrium: Right atrial size was normal in size.  Pericardium: There is no evidence of pericardial effusion.  Mitral Valve: The mitral valve is normal in structure. Trivial mitral valve regurgitation. No evidence of mitral valve stenosis. MV peak gradient, 4.2 mmHg. The mean mitral valve  gradient is 2.0 mmHg.  Tricuspid Valve: The tricuspid valve is normal in structure. Tricuspid valve regurgitation is trivial. No evidence of tricuspid stenosis.  Aortic Valve: The aortic valve is tricuspid. There is mild calcification of the aortic valve. There is mild thickening of the aortic valve. Aortic valve regurgitation is not visualized. No aortic stenosis is present.  Pulmonic Valve: The pulmonic valve was normal in structure. Pulmonic valve regurgitation is trivial. No evidence of pulmonic stenosis.  Aorta: The aortic root is normal in  Cardiology Office Note:    Date:  12/10/2022   ID:  Adam Mata, DOB [birth year], MRN [id number]  PCP:  Gus Height, PA   [facility] Providers Cardiologist:  Thurmon Fair, MD Cardiology APP:  Marcelino Duster, PA     Referring MD: Gus Height, [place]   Chief Complaint  Patient presents with   Follow-up    Nonobstructive CAD by CCTA    History of Present Illness:    Adam Mata is a 82 y.o. 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  He was recently seen by cardiology during an ER visit 11/25/2022 for evaluation of chest pressure.  He underwent CT coronary 11/26/2022 that showed coronary calcium score of 685 placing him at the 62nd percentile.  He had extensive TPP.  FFR analysis demonstrates possible borderline hemodynamically flow-limiting lesion in the mid to distal LAD.  He was recommended for aggressive medical therapy and cardiac catheterization if symptoms persisted.  Echocardiogram was also obtained and showed preserved LVEF 60-65%, no RWMA, moderate LVH, grade 1 DD, no significant valvular disease.  His cardiac enzymes remained negative.  His LDL was 77, intolerant to statins he was started on Toprol 50 mg daily and aspirin 81 once daily for coronary CTA results.  He presents today for follow up. We spend much of the visit reviewing imaging / labs, symptoms, and setting up MyChart. He has failed three statins due to muscle pain and cramping. I also has DM with A1c 6.8%. We discuss PCSK9i and SGKT2i and he is amenable to both but only has insurance through the [place]. 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 He can still achieve 4.0 METS and I have asked him to try walking 5-10 min three times daily.   Past Medical History:  Diagnosis Date   CKD (chronic kidney disease), stage III [facility])    nephrologist--- dr Marisue Humble  [facility])   History of gout    per pt last episode many yrs ago   History of kidney stones    Hyperlipidemia    Hypertension    followed by pcp   MRSA infection 2020   Right ureteral calculus    Thrombocytopenia (HCC)    followed by dr Gus Height, platlet count runs low   Type 2 diabetes mellitus (HCC)    followed by pcp   (06-17-2020 pt stated does not check blood sugar at home)   Wears glasses     Past Surgical History:  Procedure Laterality Date   CARPAL TUNNEL RELEASE Right 07/25/2012   Procedure: CARPAL TUNNEL RELEASE;  Surgeon: Wyn Forster., MD;  Location: [facility];  Service: Orthopedics;  Laterality: Right;   CARPAL TUNNEL RELEASE Left 09/19/2012   Procedure: LEFT CARPAL TUNNEL RELEASE;  Surgeon: Wyn Forster., MD;  Location: [facility];  Service: Orthopedics;  Laterality: Left;   CATARACT EXTRACTION W/ INTRAOCULAR LENS IMPLANT Right 2017   COLONOSCOPY     CYSTOSCOPY/RETROGRADE/URETEROSCOPY/STONE EXTRACTION WITH BASKET Right 06/24/2020   Procedure: CYSTOSCOPY/bILATERAL Shan Levans /URETEROSCOPY/HOLMIUM LASER RIGHT  LITHOTRIPSY/RIGHT STONE EXTRACTION WITH BASKET/ STENT PLACEMENT;  Surgeon: [name], MD;  Location: [facility];  Service: Urology;  Laterality: Right;   CYSTOSCOPY/URETEROSCOPY/HOLMIUM LASER/STENT PLACEMENT  x3  last one 01-22-2020   CYSTOSCOPY/URETEROSCOPY/HOLMIUM LASER/STENT PLACEMENT Bilateral 10/26/2022   Procedure: CYSTOSCOPY BILATERAL RETROGRADE PYELOGRAM BILATERAL URETEROSCOPY/HOLMIUM LASER/STENT PLACEMENT;  Surgeon:  Cardiology Office Note:    Date:  12/10/2022   ID:  Adam Mata, DOB [birth year], MRN [id number]  PCP:  Gus Height, PA   [facility] Providers Cardiologist:  Thurmon Fair, MD Cardiology APP:  Marcelino Duster, PA     Referring MD: Gus Height, [place]   Chief Complaint  Patient presents with   Follow-up    Nonobstructive CAD by CCTA    History of Present Illness:    Adam Mata is a 82 y.o. 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  He was recently seen by cardiology during an ER visit 11/25/2022 for evaluation of chest pressure.  He underwent CT coronary 11/26/2022 that showed coronary calcium score of 685 placing him at the 62nd percentile.  He had extensive TPP.  FFR analysis demonstrates possible borderline hemodynamically flow-limiting lesion in the mid to distal LAD.  He was recommended for aggressive medical therapy and cardiac catheterization if symptoms persisted.  Echocardiogram was also obtained and showed preserved LVEF 60-65%, no RWMA, moderate LVH, grade 1 DD, no significant valvular disease.  His cardiac enzymes remained negative.  His LDL was 77, intolerant to statins he was started on Toprol 50 mg daily and aspirin 81 once daily for coronary CTA results.  He presents today for follow up. We spend much of the visit reviewing imaging / labs, symptoms, and setting up MyChart. He has failed three statins due to muscle pain and cramping. I also has DM with A1c 6.8%. We discuss PCSK9i and SGKT2i and he is amenable to both but only has insurance through the [place]. 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 He can still achieve 4.0 METS and I have asked him to try walking 5-10 min three times daily.   Past Medical History:  Diagnosis Date   CKD (chronic kidney disease), stage III [facility])    nephrologist--- dr Marisue Humble  [facility])   History of gout    per pt last episode many yrs ago   History of kidney stones    Hyperlipidemia    Hypertension    followed by pcp   MRSA infection 2020   Right ureteral calculus    Thrombocytopenia (HCC)    followed by dr Gus Height, platlet count runs low   Type 2 diabetes mellitus (HCC)    followed by pcp   (06-17-2020 pt stated does not check blood sugar at home)   Wears glasses     Past Surgical History:  Procedure Laterality Date   CARPAL TUNNEL RELEASE Right 07/25/2012   Procedure: CARPAL TUNNEL RELEASE;  Surgeon: Wyn Forster., MD;  Location: [facility];  Service: Orthopedics;  Laterality: Right;   CARPAL TUNNEL RELEASE Left 09/19/2012   Procedure: LEFT CARPAL TUNNEL RELEASE;  Surgeon: Wyn Forster., MD;  Location: [facility];  Service: Orthopedics;  Laterality: Left;   CATARACT EXTRACTION W/ INTRAOCULAR LENS IMPLANT Right 2017   COLONOSCOPY     CYSTOSCOPY/RETROGRADE/URETEROSCOPY/STONE EXTRACTION WITH BASKET Right 06/24/2020   Procedure: CYSTOSCOPY/bILATERAL Shan Levans /URETEROSCOPY/HOLMIUM LASER RIGHT  LITHOTRIPSY/RIGHT STONE EXTRACTION WITH BASKET/ STENT PLACEMENT;  Surgeon: [name], MD;  Location: [facility];  Service: Urology;  Laterality: Right;   CYSTOSCOPY/URETEROSCOPY/HOLMIUM LASER/STENT PLACEMENT  x3  last one 01-22-2020   CYSTOSCOPY/URETEROSCOPY/HOLMIUM LASER/STENT PLACEMENT Bilateral 10/26/2022   Procedure: CYSTOSCOPY BILATERAL RETROGRADE PYELOGRAM BILATERAL URETEROSCOPY/HOLMIUM LASER/STENT PLACEMENT;  Surgeon:  Cardiology Office Note:    Date:  12/10/2022   ID:  Adam Mata, DOB [birth year], MRN [id number]  PCP:  Gus Height, PA   [facility] Providers Cardiologist:  Thurmon Fair, MD Cardiology APP:  Marcelino Duster, PA     Referring MD: Gus Height, [place]   Chief Complaint  Patient presents with   Follow-up    Nonobstructive CAD by CCTA    History of Present Illness:    Adam Mata is a 82 y.o. 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  He was recently seen by cardiology during an ER visit 11/25/2022 for evaluation of chest pressure.  He underwent CT coronary 11/26/2022 that showed coronary calcium score of 685 placing him at the 62nd percentile.  He had extensive TPP.  FFR analysis demonstrates possible borderline hemodynamically flow-limiting lesion in the mid to distal LAD.  He was recommended for aggressive medical therapy and cardiac catheterization if symptoms persisted.  Echocardiogram was also obtained and showed preserved LVEF 60-65%, no RWMA, moderate LVH, grade 1 DD, no significant valvular disease.  His cardiac enzymes remained negative.  His LDL was 77, intolerant to statins he was started on Toprol 50 mg daily and aspirin 81 once daily for coronary CTA results.  He presents today for follow up. We spend much of the visit reviewing imaging / labs, symptoms, and setting up MyChart. He has failed three statins due to muscle pain and cramping. I also has DM with A1c 6.8%. We discuss PCSK9i and SGKT2i and he is amenable to both but only has insurance through the [place]. 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 He can still achieve 4.0 METS and I have asked him to try walking 5-10 min three times daily.   Past Medical History:  Diagnosis Date   CKD (chronic kidney disease), stage III [facility])    nephrologist--- dr Marisue Humble  [facility])   History of gout    per pt last episode many yrs ago   History of kidney stones    Hyperlipidemia    Hypertension    followed by pcp   MRSA infection 2020   Right ureteral calculus    Thrombocytopenia (HCC)    followed by dr Gus Height, platlet count runs low   Type 2 diabetes mellitus (HCC)    followed by pcp   (06-17-2020 pt stated does not check blood sugar at home)   Wears glasses     Past Surgical History:  Procedure Laterality Date   CARPAL TUNNEL RELEASE Right 07/25/2012   Procedure: CARPAL TUNNEL RELEASE;  Surgeon: Wyn Forster., MD;  Location: [facility];  Service: Orthopedics;  Laterality: Right;   CARPAL TUNNEL RELEASE Left 09/19/2012   Procedure: LEFT CARPAL TUNNEL RELEASE;  Surgeon: Wyn Forster., MD;  Location: [facility];  Service: Orthopedics;  Laterality: Left;   CATARACT EXTRACTION W/ INTRAOCULAR LENS IMPLANT Right 2017   COLONOSCOPY     CYSTOSCOPY/RETROGRADE/URETEROSCOPY/STONE EXTRACTION WITH BASKET Right 06/24/2020   Procedure: CYSTOSCOPY/bILATERAL Shan Levans /URETEROSCOPY/HOLMIUM LASER RIGHT  LITHOTRIPSY/RIGHT STONE EXTRACTION WITH BASKET/ STENT PLACEMENT;  Surgeon: [name], MD;  Location: [facility];  Service: Urology;  Laterality: Right;   CYSTOSCOPY/URETEROSCOPY/HOLMIUM LASER/STENT PLACEMENT  x3  last one 01-22-2020   CYSTOSCOPY/URETEROSCOPY/HOLMIUM LASER/STENT PLACEMENT Bilateral 10/26/2022   Procedure: CYSTOSCOPY BILATERAL RETROGRADE PYELOGRAM BILATERAL URETEROSCOPY/HOLMIUM LASER/STENT PLACEMENT;  Surgeon:

## 2022-12-10 ENCOUNTER — Encounter: Payer: Self-pay | Admitting: Physician Assistant

## 2022-12-10 ENCOUNTER — Ambulatory Visit: Payer: Medicare Other | Attending: Physician Assistant | Admitting: Physician Assistant

## 2022-12-10 VITALS — BP 130/72 | HR 75 | Ht 68.0 in | Wt 217.2 lb

## 2022-12-10 DIAGNOSIS — N1831 Chronic kidney disease, stage 3a: Secondary | ICD-10-CM | POA: Insufficient documentation

## 2022-12-10 DIAGNOSIS — E785 Hyperlipidemia, unspecified: Secondary | ICD-10-CM | POA: Diagnosis not present

## 2022-12-10 DIAGNOSIS — I10 Essential (primary) hypertension: Secondary | ICD-10-CM | POA: Diagnosis not present

## 2022-12-10 DIAGNOSIS — E119 Type 2 diabetes mellitus without complications: Secondary | ICD-10-CM | POA: Diagnosis not present

## 2022-12-10 DIAGNOSIS — I2510 Atherosclerotic heart disease of native coronary artery without angina pectoris: Secondary | ICD-10-CM | POA: Insufficient documentation

## 2022-12-10 MED ORDER — NITROGLYCERIN 0.4 MG SL SUBL: 0.4000 mg | SUBLINGUAL_TABLET | SUBLINGUAL | 3 refills | Status: AC | PRN

## 2022-12-10 NOTE — Patient Instructions (Addendum)
Medication Instructions:  Nitroglycerin 0.4 mg SL take 1 tab as needed for chest pain.   *If you need a refill on your cardiac medications before your next appointment, please call your pharmacy*   Lab Work: NONE ordered at this time of appointment   Testing/Procedures: NONE ordered at this time of appointment     Follow-Up: At [facility], you and your health needs are our priority.  As part of our continuing mission to provide you with exceptional heart care, we have created designated Provider Care Teams.  These Care Teams include your primary Cardiologist (physician) and Advanced Practice Providers (APPs -  Physician Assistants and Nurse Practitioners) who all work together to provide you with the care you need, when you need it.  We recommend signing up for the patient portal called "MyChart".  Sign up information is provided on this After Visit Summary.  MyChart is used to connect with patients for Virtual Visits (Telemedicine).  Patients are able to view lab/test results, encounter notes, upcoming appointments, etc.  Non-urgent messages can be sent to your provider as well.   To learn more about what you can do with MyChart, go to [email or web address].    Your next appointment:   1 year(s)  Provider:   Thurmon Fair, MD  or Micah Flesher, PA-C        Other Instructions You failed 3 statin mediations and we recommend PCSK9 inhibitor with LDL less than 50. 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

## 2022-12-15 ENCOUNTER — Telehealth: Payer: Self-pay | Admitting: Cardiovascular Disease

## 2022-12-15 NOTE — Telephone Encounter (Signed)
    Primary Cardiologist: Thurmon Fair, MD  Chart reviewed as part of pre-operative protocol coverage. Simple dental extractions and cleanings are considered low risk procedures per guidelines and generally do not require any specific cardiac clearance. It is also generally accepted that for simple extractions and dental cleanings, there is no need to interrupt blood thinner therapy.   SBE prophylaxis is not required for the patient.  I will route this recommendation to the requesting party via Epic fax function and remove from pre-op pool.  Please call with questions.  Sharlene Dory, PA-C 12/15/2022, 3:16 PM

## 2022-12-15 NOTE — Telephone Encounter (Signed)
Pre-operative Risk Assessment    Patient Name: Adam Mata  DOB: December 14, 1940 MRN: [id number]     Request for Surgical Clearance    Procedure:   Cleaning   Date of Surgery:  Clearance TBD                                 Surgeon:  Dr. Threasa Beards  Surgeon's Group or Practice Name:  [facility]  Phone number:  [phone] Fax number:  [phone]   Type of Clearance Requested:   - Medical    Type of Anesthesia:   Septocaine   Additional requests/questions:    Signed, April Henson   12/15/2022, 2:57 PM

## 2023-01-06 DIAGNOSIS — R351 Nocturia: Secondary | ICD-10-CM | POA: Diagnosis not present

## 2023-01-06 DIAGNOSIS — R3912 Poor urinary stream: Secondary | ICD-10-CM | POA: Diagnosis not present

## 2023-01-06 DIAGNOSIS — N202 Calculus of kidney with calculus of ureter: Secondary | ICD-10-CM | POA: Diagnosis not present

## 2023-01-06 DIAGNOSIS — N130 Hydronephrosis with ureteropelvic junction obstruction: Secondary | ICD-10-CM | POA: Diagnosis not present

## 2023-01-06 DIAGNOSIS — N200 Calculus of kidney: Secondary | ICD-10-CM | POA: Diagnosis not present

## 2023-01-26 DIAGNOSIS — N200 Calculus of kidney: Secondary | ICD-10-CM | POA: Diagnosis not present

## 2023-02-16 DIAGNOSIS — Z6832 Body mass index (BMI) 32.0-32.9, adult: Secondary | ICD-10-CM | POA: Diagnosis not present

## 2023-02-16 DIAGNOSIS — N1831 Chronic kidney disease, stage 3a: Secondary | ICD-10-CM | POA: Diagnosis not present

## 2023-02-16 DIAGNOSIS — M25511 Pain in right shoulder: Secondary | ICD-10-CM | POA: Diagnosis not present

## 2023-02-16 DIAGNOSIS — D696 Thrombocytopenia, unspecified: Secondary | ICD-10-CM | POA: Diagnosis not present

## 2023-02-16 DIAGNOSIS — I129 Hypertensive chronic kidney disease with stage 1 through stage 4 chronic kidney disease, or unspecified chronic kidney disease: Secondary | ICD-10-CM | POA: Diagnosis not present

## 2023-02-16 DIAGNOSIS — E782 Mixed hyperlipidemia: Secondary | ICD-10-CM | POA: Diagnosis not present

## 2023-02-16 DIAGNOSIS — E1122 Type 2 diabetes mellitus with diabetic chronic kidney disease: Secondary | ICD-10-CM | POA: Diagnosis not present

## 2023-03-24 DIAGNOSIS — M67813 Other specified disorders of tendon, right shoulder: Secondary | ICD-10-CM | POA: Diagnosis not present

## 2023-03-24 DIAGNOSIS — M67814 Other specified disorders of tendon, left shoulder: Secondary | ICD-10-CM | POA: Diagnosis not present

## 2023-03-24 DIAGNOSIS — M5412 Radiculopathy, cervical region: Secondary | ICD-10-CM | POA: Diagnosis not present

## 2023-04-20 DIAGNOSIS — M67814 Other specified disorders of tendon, left shoulder: Secondary | ICD-10-CM | POA: Diagnosis not present

## 2023-04-20 DIAGNOSIS — M5412 Radiculopathy, cervical region: Secondary | ICD-10-CM | POA: Diagnosis not present

## 2023-04-20 DIAGNOSIS — M67813 Other specified disorders of tendon, right shoulder: Secondary | ICD-10-CM | POA: Diagnosis not present

## 2023-05-04 DIAGNOSIS — N130 Hydronephrosis with ureteropelvic junction obstruction: Secondary | ICD-10-CM | POA: Diagnosis not present

## 2023-05-04 DIAGNOSIS — N200 Calculus of kidney: Secondary | ICD-10-CM | POA: Diagnosis not present

## 2023-05-11 DIAGNOSIS — N183 Chronic kidney disease, stage 3 unspecified: Secondary | ICD-10-CM | POA: Diagnosis not present

## 2023-05-18 DIAGNOSIS — N281 Cyst of kidney, acquired: Secondary | ICD-10-CM | POA: Diagnosis not present

## 2023-05-18 DIAGNOSIS — N200 Calculus of kidney: Secondary | ICD-10-CM | POA: Diagnosis not present

## 2023-05-18 DIAGNOSIS — N130 Hydronephrosis with ureteropelvic junction obstruction: Secondary | ICD-10-CM | POA: Diagnosis not present

## 2023-05-18 DIAGNOSIS — K5730 Diverticulosis of large intestine without perforation or abscess without bleeding: Secondary | ICD-10-CM | POA: Diagnosis not present

## 2023-05-18 DIAGNOSIS — N132 Hydronephrosis with renal and ureteral calculous obstruction: Secondary | ICD-10-CM | POA: Diagnosis not present

## 2023-05-26 DIAGNOSIS — I2510 Atherosclerotic heart disease of native coronary artery without angina pectoris: Secondary | ICD-10-CM | POA: Diagnosis not present

## 2023-05-26 DIAGNOSIS — N200 Calculus of kidney: Secondary | ICD-10-CM | POA: Diagnosis not present

## 2023-05-26 DIAGNOSIS — I10 Essential (primary) hypertension: Secondary | ICD-10-CM | POA: Diagnosis not present

## 2023-05-26 DIAGNOSIS — N183 Chronic kidney disease, stage 3 unspecified: Secondary | ICD-10-CM | POA: Diagnosis not present

## 2023-05-26 DIAGNOSIS — I129 Hypertensive chronic kidney disease with stage 1 through stage 4 chronic kidney disease, or unspecified chronic kidney disease: Secondary | ICD-10-CM | POA: Diagnosis not present

## 2023-05-26 DIAGNOSIS — E1122 Type 2 diabetes mellitus with diabetic chronic kidney disease: Secondary | ICD-10-CM | POA: Diagnosis not present

## 2023-05-26 DIAGNOSIS — R809 Proteinuria, unspecified: Secondary | ICD-10-CM | POA: Diagnosis not present

## 2023-05-26 DIAGNOSIS — E119 Type 2 diabetes mellitus without complications: Secondary | ICD-10-CM | POA: Diagnosis not present

## 2023-06-20 DIAGNOSIS — N202 Calculus of kidney with calculus of ureter: Secondary | ICD-10-CM | POA: Diagnosis not present

## 2023-06-20 DIAGNOSIS — N401 Enlarged prostate with lower urinary tract symptoms: Secondary | ICD-10-CM | POA: Diagnosis not present

## 2023-06-20 DIAGNOSIS — R351 Nocturia: Secondary | ICD-10-CM | POA: Diagnosis not present

## 2023-06-20 DIAGNOSIS — N130 Hydronephrosis with ureteropelvic junction obstruction: Secondary | ICD-10-CM | POA: Diagnosis not present

## 2023-06-23 DIAGNOSIS — M25512 Pain in left shoulder: Secondary | ICD-10-CM | POA: Diagnosis not present

## 2023-06-23 DIAGNOSIS — M67814 Other specified disorders of tendon, left shoulder: Secondary | ICD-10-CM | POA: Diagnosis not present

## 2023-06-23 DIAGNOSIS — M67813 Other specified disorders of tendon, right shoulder: Secondary | ICD-10-CM | POA: Diagnosis not present

## 2023-08-16 DIAGNOSIS — I82412 Acute embolism and thrombosis of left femoral vein: Secondary | ICD-10-CM | POA: Diagnosis not present

## 2023-08-16 DIAGNOSIS — Z6834 Body mass index (BMI) 34.0-34.9, adult: Secondary | ICD-10-CM | POA: Diagnosis not present

## 2023-08-16 DIAGNOSIS — D696 Thrombocytopenia, unspecified: Secondary | ICD-10-CM | POA: Diagnosis not present

## 2023-08-16 DIAGNOSIS — M7989 Other specified soft tissue disorders: Secondary | ICD-10-CM | POA: Diagnosis not present

## 2023-08-16 DIAGNOSIS — E1122 Type 2 diabetes mellitus with diabetic chronic kidney disease: Secondary | ICD-10-CM | POA: Diagnosis not present

## 2023-08-16 DIAGNOSIS — I129 Hypertensive chronic kidney disease with stage 1 through stage 4 chronic kidney disease, or unspecified chronic kidney disease: Secondary | ICD-10-CM | POA: Diagnosis not present

## 2023-08-16 DIAGNOSIS — N1831 Chronic kidney disease, stage 3a: Secondary | ICD-10-CM | POA: Diagnosis not present

## 2023-08-16 DIAGNOSIS — E782 Mixed hyperlipidemia: Secondary | ICD-10-CM | POA: Diagnosis not present

## 2023-08-16 DIAGNOSIS — I824Z2 Acute embolism and thrombosis of unspecified deep veins of left distal lower extremity: Secondary | ICD-10-CM | POA: Diagnosis not present

## 2023-12-09 DIAGNOSIS — N1831 Chronic kidney disease, stage 3a: Secondary | ICD-10-CM | POA: Diagnosis not present

## 2023-12-09 DIAGNOSIS — I129 Hypertensive chronic kidney disease with stage 1 through stage 4 chronic kidney disease, or unspecified chronic kidney disease: Secondary | ICD-10-CM | POA: Diagnosis not present

## 2023-12-09 DIAGNOSIS — D696 Thrombocytopenia, unspecified: Secondary | ICD-10-CM | POA: Diagnosis not present

## 2023-12-09 DIAGNOSIS — I82532 Chronic embolism and thrombosis of left popliteal vein: Secondary | ICD-10-CM | POA: Diagnosis not present

## 2023-12-09 DIAGNOSIS — I82402 Acute embolism and thrombosis of unspecified deep veins of left lower extremity: Secondary | ICD-10-CM | POA: Diagnosis not present

## 2023-12-09 DIAGNOSIS — E782 Mixed hyperlipidemia: Secondary | ICD-10-CM | POA: Diagnosis not present

## 2023-12-09 DIAGNOSIS — E1122 Type 2 diabetes mellitus with diabetic chronic kidney disease: Secondary | ICD-10-CM | POA: Diagnosis not present

## 2023-12-09 DIAGNOSIS — Z6835 Body mass index (BMI) 35.0-35.9, adult: Secondary | ICD-10-CM | POA: Diagnosis not present

## 2023-12-09 DIAGNOSIS — Z2821 Immunization not carried out because of patient refusal: Secondary | ICD-10-CM | POA: Diagnosis not present

## 2023-12-26 DIAGNOSIS — N401 Enlarged prostate with lower urinary tract symptoms: Secondary | ICD-10-CM | POA: Diagnosis not present

## 2023-12-26 DIAGNOSIS — N200 Calculus of kidney: Secondary | ICD-10-CM | POA: Diagnosis not present
